# Patient Record
Sex: Male | Born: 1960 | Race: White | Hispanic: No | Smoking: Former smoker
Health system: Southern US, Community
[De-identification: ages and names within clinical notes are randomized; demographics above are authoritative.]

## PROBLEM LIST (undated history)

## (undated) DIAGNOSIS — I10 Essential (primary) hypertension: Secondary | ICD-10-CM

## (undated) DIAGNOSIS — E785 Hyperlipidemia, unspecified: Secondary | ICD-10-CM

## (undated) DIAGNOSIS — E7800 Pure hypercholesterolemia, unspecified: Secondary | ICD-10-CM

## (undated) HISTORY — PX: VASECTOMY: SHX75

## (undated) HISTORY — PX: WISDOM TOOTH EXTRACTION: SHX21

## (undated) HISTORY — DX: Essential (primary) hypertension: I10

## (undated) HISTORY — PX: COLONOSCOPY: SHX174

---

## 2010-01-11 ENCOUNTER — Ambulatory Visit: Payer: Self-pay | Admitting: Gastroenterology

## 2015-05-01 DIAGNOSIS — Z8673 Personal history of transient ischemic attack (TIA), and cerebral infarction without residual deficits: Secondary | ICD-10-CM

## 2015-05-01 HISTORY — DX: Personal history of transient ischemic attack (TIA), and cerebral infarction without residual deficits: Z86.73

## 2016-03-26 ENCOUNTER — Inpatient Hospital Stay (HOSPITAL_COMMUNITY)
Admission: EM | Admit: 2016-03-26 | Discharge: 2016-03-27 | DRG: 065 | Disposition: A | Discharge status: Home / Self Care | Payer: Managed Care, Other (non HMO) | Attending: Internal Medicine | Admitting: Internal Medicine

## 2016-03-26 ENCOUNTER — Encounter (HOSPITAL_COMMUNITY): Payer: Self-pay

## 2016-03-26 ENCOUNTER — Emergency Department (HOSPITAL_COMMUNITY): Payer: Managed Care, Other (non HMO)

## 2016-03-26 DIAGNOSIS — R531 Weakness: Secondary | ICD-10-CM

## 2016-03-26 DIAGNOSIS — Z6832 Body mass index (BMI) 32.0-32.9, adult: Secondary | ICD-10-CM

## 2016-03-26 DIAGNOSIS — R479 Unspecified speech disturbances: Secondary | ICD-10-CM | POA: Diagnosis present

## 2016-03-26 DIAGNOSIS — R4781 Slurred speech: Secondary | ICD-10-CM | POA: Diagnosis not present

## 2016-03-26 DIAGNOSIS — E7800 Pure hypercholesterolemia, unspecified: Secondary | ICD-10-CM | POA: Diagnosis present

## 2016-03-26 DIAGNOSIS — G458 Other transient cerebral ischemic attacks and related syndromes: Secondary | ICD-10-CM | POA: Diagnosis not present

## 2016-03-26 DIAGNOSIS — R030 Elevated blood-pressure reading, without diagnosis of hypertension: Secondary | ICD-10-CM | POA: Diagnosis not present

## 2016-03-26 DIAGNOSIS — R29898 Other symptoms and signs involving the musculoskeletal system: Secondary | ICD-10-CM | POA: Diagnosis not present

## 2016-03-26 DIAGNOSIS — I638 Other cerebral infarction: Principal | ICD-10-CM | POA: Diagnosis present

## 2016-03-26 DIAGNOSIS — I10 Essential (primary) hypertension: Secondary | ICD-10-CM | POA: Diagnosis present

## 2016-03-26 DIAGNOSIS — I161 Hypertensive emergency: Secondary | ICD-10-CM | POA: Diagnosis present

## 2016-03-26 DIAGNOSIS — E785 Hyperlipidemia, unspecified: Secondary | ICD-10-CM

## 2016-03-26 DIAGNOSIS — I998 Other disorder of circulatory system: Secondary | ICD-10-CM | POA: Diagnosis present

## 2016-03-26 DIAGNOSIS — Z87891 Personal history of nicotine dependence: Secondary | ICD-10-CM

## 2016-03-26 DIAGNOSIS — R2990 Unspecified symptoms and signs involving the nervous system: Secondary | ICD-10-CM

## 2016-03-26 DIAGNOSIS — E669 Obesity, unspecified: Secondary | ICD-10-CM | POA: Diagnosis present

## 2016-03-26 DIAGNOSIS — G8321 Monoplegia of upper limb affecting right dominant side: Secondary | ICD-10-CM | POA: Diagnosis present

## 2016-03-26 DIAGNOSIS — I639 Cerebral infarction, unspecified: Secondary | ICD-10-CM

## 2016-03-26 DIAGNOSIS — Z7982 Long term (current) use of aspirin: Secondary | ICD-10-CM

## 2016-03-26 HISTORY — DX: Pure hypercholesterolemia, unspecified: E78.00

## 2016-03-26 LAB — COMPREHENSIVE METABOLIC PANEL
ALBUMIN: 4.3 g/dL (ref 3.5–5.0)
ALK PHOS: 74 U/L (ref 38–126)
ALT: 23 U/L (ref 17–63)
AST: 25 U/L (ref 15–41)
Anion gap: 8 (ref 5–15)
BUN: 23 mg/dL — AB (ref 6–20)
CHLORIDE: 105 mmol/L (ref 101–111)
CO2: 26 mmol/L (ref 22–32)
CREATININE: 1.16 mg/dL (ref 0.61–1.24)
Calcium: 9.6 mg/dL (ref 8.9–10.3)
GFR calc Af Amer: >60 mL/min (ref >60)
GFR calc non Af Amer: >60 mL/min (ref >60)
GLUCOSE: 118 mg/dL — AB (ref 65–99)
Potassium: 4.3 mmol/L (ref 3.5–5.1)
SODIUM: 139 mmol/L (ref 135–145)
Total Bilirubin: 0.3 mg/dL (ref 0.3–1.2)
Total Protein: 6.3 g/dL — ABNORMAL LOW (ref 6.5–8.1)

## 2016-03-26 LAB — CBC
HCT: 43.1 % (ref 39.0–52.0)
Hemoglobin: 15.1 g/dL (ref 13.0–17.0)
MCH: 30.4 pg (ref 26.0–34.0)
MCHC: 35 g/dL (ref 30.0–36.0)
MCV: 86.9 fL (ref 78.0–100.0)
PLATELETS: 264 10*3/uL (ref 150–400)
RBC: 4.96 MIL/uL (ref 4.22–5.81)
RDW: 12.4 % (ref 11.5–15.5)
WBC: 8.9 10*3/uL (ref 4.0–10.5)

## 2016-03-26 LAB — PROTIME-INR
INR: 0.89
PROTHROMBIN TIME: 12 s (ref 11.4–15.2)

## 2016-03-26 LAB — DIFFERENTIAL
BASOS ABS: 0 10*3/uL (ref 0.0–0.1)
BASOS PCT: 0 %
Eosinophils Absolute: 0.2 10*3/uL (ref 0.0–0.7)
Eosinophils Relative: 2 %
LYMPHS PCT: 40 %
Lymphs Abs: 3.6 10*3/uL (ref 0.7–4.0)
Monocytes Absolute: 0.9 10*3/uL (ref 0.1–1.0)
Monocytes Relative: 10 %
NEUTROS ABS: 4.2 10*3/uL (ref 1.7–7.7)
Neutrophils Relative %: 48 %

## 2016-03-26 LAB — I-STAT CHEM 8, ED
BUN: 26 mg/dL — ABNORMAL HIGH (ref 6–20)
CHLORIDE: 104 mmol/L (ref 101–111)
CREATININE: 1.2 mg/dL (ref 0.61–1.24)
Calcium, Ion: 1.19 mmol/L (ref 1.15–1.40)
Glucose, Bld: 113 mg/dL — ABNORMAL HIGH (ref 65–99)
HEMATOCRIT: 44 % (ref 39.0–52.0)
HEMOGLOBIN: 15 g/dL (ref 13.0–17.0)
POTASSIUM: 4.2 mmol/L (ref 3.5–5.1)
Sodium: 142 mmol/L (ref 135–145)
TCO2: 26 mmol/L (ref 0–100)

## 2016-03-26 LAB — CBG MONITORING, ED: Glucose-Capillary: 107 mg/dL — ABNORMAL HIGH (ref 65–99)

## 2016-03-26 LAB — I-STAT TROPONIN, ED: Troponin i, poc: 0 ng/mL (ref 0.00–0.08)

## 2016-03-26 LAB — APTT: APTT: 27 s (ref 24–36)

## 2016-03-26 MED ORDER — ASPIRIN 300 MG RE SUPP: 300.0000 mg | Freq: Every day | RECTAL | Status: DC

## 2016-03-26 MED ORDER — ACETAMINOPHEN 325 MG PO TABS: 650.0000 mg | ORAL_TABLET | ORAL | Status: DC | PRN

## 2016-03-26 MED ORDER — ASPIRIN 325 MG PO TABS
325.0000 mg | ORAL_TABLET | Freq: Every day | ORAL | Status: DC
  Administered 2016-03-27: 325 mg via ORAL
  Filled 2016-03-26: qty 1

## 2016-03-26 MED ORDER — HYDRALAZINE HCL 20 MG/ML IJ SOLN: 10.0000 mg | INTRAMUSCULAR | Status: DC | PRN

## 2016-03-26 MED ORDER — HYDRALAZINE HCL 20 MG/ML IJ SOLN
10.0000 mg | INTRAMUSCULAR | Status: DC
  Filled 2016-03-26: qty 1

## 2016-03-26 MED ORDER — ACETAMINOPHEN 650 MG RE SUPP: 650.0000 mg | RECTAL | Status: DC | PRN

## 2016-03-26 MED ORDER — STROKE: EARLY STAGES OF RECOVERY BOOK
Freq: Once | Status: AC
  Administered 2016-03-27: 1
  Filled 2016-03-26: qty 1

## 2016-03-26 NOTE — ED Triage Notes (Signed)
Pt comlpaining of Right sided lip numbness and R arm weakness. Pt with marked weakness in R arm. Pt states normal sensation in bilateral extremities. Jeraldine LootsLockwood, MD at bedside.

## 2016-03-26 NOTE — ED Provider Notes (Signed)
[facility]-EMERGENCY DEPT Provider Note   CSN: [id number] Arrival date & time: 03/26/16  2214   An emergency department physician performed an initial assessment on this suspected stroke patient at 2225.  History   Chief Complaint Chief Complaint  Patient presents with  . Code Stroke    HPI Judd LienJack Brule is a 55 y.o. male.  HPI Patient presents with new onset right perioral numbness, right arm discoordination. Last seen normal was about one hour prior to ED arrival. Patient states that he was well prior to this. Family member with the patient states the patient also has speech difficulty. Patient denies pain, confusion, disorientation. No recent medication, left eye, activity changes.  Past Medical History:  Diagnosis Date  . Hypercholesterolemia     Patient Active Problem List   Diagnosis Date Noted  . Speech abnormality 03/26/2016  . Stroke-like symptoms 03/26/2016    Past Surgical History:  Procedure Laterality Date  . VASECTOMY         Home Medications    Prior to Admission medications   Medication Sig Start Date End Date Taking? Authorizing Provider  fluticasone (FLONASE) 50 MCG/ACT nasal spray Place 1-2 sprays into both nostrils daily as needed for allergies.  01/21/16   Historical Provider, MD    Family History Family History  Problem Relation Age of Onset  . Suicidality Father   . Hypertension Paternal Grandmother   . Hypertension Paternal Grandfather     Social History Social History  Substance Use Topics  . Smoking status: Former Games developermoker  . Smokeless tobacco: Never Used  . Alcohol use No     Comment: Former EtOH use, quit four years ago.     Allergies   Patient has no known allergies.   Review of Systems Review of Systems  Constitutional:       Per HPI, otherwise negative  HENT:       Per HPI, otherwise negative  Respiratory:       Per HPI, otherwise negative  Cardiovascular:       Per HPI, otherwise negative  Gastrointestinal:  Negative for vomiting.  Endocrine:       Negative aside from HPI  Genitourinary:       Neg aside from HPI   Musculoskeletal:       Per HPI, otherwise negative  Skin: Negative.   Neurological: Positive for speech difficulty and numbness. Negative for seizures, syncope, facial asymmetry and headaches.     Physical Exam Updated Vital Signs BP 181/100   Pulse 89   Temp 98.5 F (36.9 C)   Resp 20   Wt 220 lb 10.9 oz (100.1 kg)   SpO2 98%   Physical Exam  Constitutional: He is oriented to person, place, and time. He appears well-developed. No distress.  HENT:  Head: Normocephalic and atraumatic.  Eyes: Conjunctivae and EOM are normal.  Cardiovascular: Normal rate and regular rhythm.   Pulmonary/Chest: Effort normal. No stridor. No respiratory distress.  Abdominal: He exhibits no distension.  Musculoskeletal: He exhibits no edema.  Neurological: He is alert and oriented to person, place, and time. He displays no atrophy. He exhibits abnormal muscle tone.  Notable inability for the patient to perform finger-nose alternating motion with the right arm compared to the left arm, with spastic movements of the right arm, unremarkable left arm, lower extremity neurologic evaluation. Speech is clear, brief.   Skin: Skin is warm and dry.  Psychiatric: He has a normal mood and affect.  Nursing note and vitals reviewed.  ED Treatments / Results  Labs (all labs ordered are listed, but only abnormal results are displayed) Labs Reviewed  COMPREHENSIVE METABOLIC PANEL - Abnormal; Notable for the following:       Result Value   Glucose, Bld 118 (*)    BUN 23 (*)    Total Protein 6.3 (*)    All other components within normal limits  CBG MONITORING, ED - Abnormal; Notable for the following:    Glucose-Capillary 107 (*)    All other components within normal limits  I-STAT CHEM 8, ED - Abnormal; Notable for the following:    BUN 26 (*)    Glucose, Bld 113 (*)    All other components  within normal limits  PROTIME-INR  APTT  CBC  DIFFERENTIAL  I-STAT TROPOININ, ED    EKG  EKG Interpretation  Date/Time:  Monday March 26 2016 22:23:58 EST Ventricular Rate:  96 PR Interval:  168 QRS Duration: 72 QT Interval:  344 QTC Calculation: 434 R Axis:   39 Text Interpretation:  Normal sinus rhythm Artifact ST-t wave abnormality Abnormal ekg Confirmed by Gerhard MunchLOCKWOOD, Deshan Hemmelgarn  MD [phone]([id number]) on 03/26/2016 10:27:38 PM       Radiology Ct Head Code Stroke W/o Cm  Result Date: 03/26/2016 CLINICAL DATA:  Code stroke. Right arm weakness and left facial droop EXAM: CT HEAD WITHOUT CONTRAST TECHNIQUE: Contiguous axial images were obtained from the base of the skull through the vertex without intravenous contrast. COMPARISON:  None. FINDINGS: Brain: No mass lesion, intraparenchymal hemorrhage or extra-axial collection. No evidence of acute cortical infarct. Brain parenchyma and CSF-containing spaces are normal for age. Vascular: No hyperdense vessel or unexpected calcification. Skull: Normal visualized skull base, calvarium and extracranial soft tissues. Sinuses/Orbits: No sinus fluid levels or advanced mucosal thickening. No mastoid effusion. Normal orbits. ASPECTS [facility]([place] Stroke Program Early CT Score) - Ganglionic level infarction (caudate, lentiform nuclei, internal capsule, insula, M1-M3 cortex): 7 - Supraganglionic infarction (M4-M6 cortex): 3 Total score (0-10 with 10 being normal): 10 IMPRESSION: 1. Normal head CT. 2. ASPECTS is 10. These results were called by telephone at the time of interpretation on 03/26/2016 at 10:48 pm to Dr. Noel Christmasharles Stewart, who verbally acknowledged these results. Electronically Signed   By: Deatra RobinsonKevin  Herman M.D.   On: 03/26/2016 22:48    Procedures Procedures (including critical care time)  Medications Ordered in ED Medications  hydrALAZINE (APRESOLINE) injection 10 mg (not administered)     Initial Impression / Assessment and Plan / ED Course  I have  reviewed the triage vital signs and the nursing notes.  Pertinent labs & imaging results that were available during my care of the patient were reviewed by me and considered in my medical decision making (see chart for details).  Clinical Course     On repeat exam patient is similar condition. Initial CT does not demonstrate hemorrhage. Patient's blood pressure was initially 180 systolic, but increased to over 200 systolic. Patient received a dose of hydralazine. After discussion with our neurology colleagues, review of initial labs, the patient was admitted to the hospitalist team for further evaluation of strokelike symptoms, concern for TIA versus stroke.   Final Clinical Impressions(s) / ED Diagnoses   Final diagnoses:  Slurred speech  Acute right-sided weakness  Stroke-like symptoms    New Prescriptions New Prescriptions   No medications on file     Gerhard Munchobert Kamaree Berkel, MD 03/26/16 2337

## 2016-03-26 NOTE — H&P (Signed)
History and Physical    Jeffery LienJack Paro EAV:[id number] DOB: Dec 20, 1960 DOA: 03/26/2016  PCP: Evelene CroonNIEMEYER, MEINDERT, MD   Patient coming from: Home  Chief Complaint: Right face numbness and right arm weakness  HPI: Jeffery Small is a 55 y.o. gentleman with a history of hypercholesterolemia (says that he took medication for one year then stopped under the direction of his PCP) who presents to the ED for evaluation of acute onset right face/lip numbness and right upper extremity weakness and numbness.  Symptoms started around 9:30pm while he was watching TV.  He called for his daughter, who noted mild slurred speech but no facial droop.  No weakness in his legs.  He did not take anything for his symptoms at home and he did not call 911.  He came to the ED by private vehicle.   ED Course: Code Stroke was called.  The patient has been evaluated by neurology.  Head CT does not show acute findings.  MRI of the brain is pending.  He has markedly elevated blood pressures (denies prior history).  He has received one dose of IV hydralazine in the ED.  Hospitalist asked to place in observation while further neuro eval is pending.    Review of Systems: As per HPI otherwise 10 point review of systems negative.    Past Medical History:  Diagnosis Date  . Hypercholesterolemia     Past Surgical History:  Procedure Laterality Date  . VASECTOMY       reports that he has quit smoking. He has never used smokeless tobacco. He reports that he does not drink alcohol or use drugs. Stopped smoking 8-9 years ago. Stopped drinking EtOH 4 years ago. No illicit drug use. He is divorced.  He has two adult children.  No Known Allergies  Family History  Problem Relation Age of Onset  . Suicidality Father   . Hypertension Paternal Grandmother   . Hypertension Paternal Grandfather   He reports that he mother and two sisters are alive and healthy.   Prior to Admission medications   Medication Sig Start Date End Date  Taking? Authorizing Provider  fluticasone (FLONASE) 50 MCG/ACT nasal spray Place 1-2 sprays into both nostrils daily as needed for allergies.  01/21/16   Historical Provider, MD    Physical Exam: Vitals:   03/26/16 2245 03/26/16 2300 03/26/16 2315 03/26/16 2326  BP: (!) 206/106 (!) 161/103 181/100   Pulse: 94 87 89   Resp: 18 17 20    Temp:    98.5 F (36.9 C)  SpO2: 99% 97% 98%   Weight:          Constitutional: NAD, calm, comfortable Vitals:   03/26/16 2245 03/26/16 2300 03/26/16 2315 03/26/16 2326  BP: (!) 206/106 (!) 161/103 181/100   Pulse: 94 87 89   Resp: 18 17 20    Temp:    98.5 F (36.9 C)  SpO2: 99% 97% 98%   Weight:       Eyes: PERRL, lids and conjunctivae normal ENMT: Mucous membranes are moist. Posterior pharynx clear of any exudate or lesions. Normal dentition.  Neck: normal appearance, supple Respiratory: clear to auscultation bilaterally, no wheezing, no crackles. Normal respiratory effort. No accessory muscle use.  Cardiovascular: Normal rate, regular rhythm, no murmurs / rubs / gallops. No extremity edema. 2+ pedal pulses. No carotid bruits.  GI: abdomen is soft and compressible.  No distention.  No tenderness.  No masses palpated.  Bowel sounds are present. Musculoskeletal:  No joint deformity in upper  and lower extremities. Good ROM, no contractures. Normal muscle tone.  Skin: no rashes, warm and dry Neurologic: CN 2-12 grossly intact. Sensation still diminished in the 4th and 5th digits of his right hand.  He says that his facial numbness has resolved.  He may still have subtle weakness in his right arm compared to left, but it is very mild.  Strength and gross motor function symmetric in lower extremities.  Very mild pronator drift in the right.  Psychiatric: Normal judgment and insight. Alert and oriented x 3. Normal mood.     Labs on Admission: I have personally reviewed following labs and imaging studies  CBC:  Recent Labs Lab 03/26/16 2222  03/26/16 2236  WBC 8.9  --   NEUTROABS 4.2  --   HGB 15.1 15.0  HCT 43.1 44.0  MCV 86.9  --   PLT 264  --    Basic Metabolic Panel:  Recent Labs Lab 03/26/16 2222 03/26/16 2236  NA 139 142  K 4.3 4.2  CL 105 104  CO2 26  --   GLUCOSE 118* 113*  BUN 23* 26*  CREATININE 1.16 1.20  CALCIUM 9.6  --    GFR: CrCl cannot be calculated (Unknown ideal weight.). Liver Function Tests:  Recent Labs Lab 03/26/16 2222  AST 25  ALT 23  ALKPHOS 74  BILITOT 0.3  PROT 6.3*  ALBUMIN 4.3   Coagulation Profile:  Recent Labs Lab 03/26/16 2222  INR 0.89   CBG:  Recent Labs Lab 03/26/16 2248  GLUCAP 107*    Radiological Exams on Admission: Ct Head Code Stroke W/o Cm  Result Date: 03/26/2016 CLINICAL DATA:  Code stroke. Right arm weakness and left facial droop EXAM: CT HEAD WITHOUT CONTRAST TECHNIQUE: Contiguous axial images were obtained from the base of the skull through the vertex without intravenous contrast. COMPARISON:  None. FINDINGS: Brain: No mass lesion, intraparenchymal hemorrhage or extra-axial collection. No evidence of acute cortical infarct. Brain parenchyma and CSF-containing spaces are normal for age. Vascular: No hyperdense vessel or unexpected calcification. Skull: Normal visualized skull base, calvarium and extracranial soft tissues. Sinuses/Orbits: No sinus fluid levels or advanced mucosal thickening. No mastoid effusion. Normal orbits. ASPECTS [facility]([place] Stroke Program Early CT Score) - Ganglionic level infarction (caudate, lentiform nuclei, internal capsule, insula, M1-M3 cortex): 7 - Supraganglionic infarction (M4-M6 cortex): 3 Total score (0-10 with 10 being normal): 10 IMPRESSION: 1. Normal head CT. 2. ASPECTS is 10. These results were called by telephone at the time of interpretation on 03/26/2016 at 10:48 pm to Dr. Noel Christmasharles Stewart, who verbally acknowledged these results. Electronically Signed   By: Deatra RobinsonKevin  Herman M.D.   On: 03/26/2016 22:48    EKG:  Independently reviewed. NSR.  No acute ST elevations.  Assessment/Plan Active Problems:   Speech abnormality   Stroke-like symptoms      Right face numbness and RUE weakness with markedly elevated blood pressures concerning for acute CVA --Place in observation.  Symptoms have improved rapidly.  Chief complaint now is that sensation is not back to normal in his right hand. --Neurology consult greatly appreciated. --MRI/MRA brain pending --Complete echo and carotid ultrasound --Check A1c and lipid panel for screen for additional stroke risk factors --PT/OT/Speech evals  --RN stroke swallow screen performed in the ED.  Heart healthy diet ordered in the ED. --Treat with full strength aspirin for now  Elevated blood pressure without a history of HTN --Permissive HTN for now while evaluating for stroke --Will need to placed on anti-hypertensives when  OK with neurology  History of hypercholesterolemia --Fasting lipid panel will be checked as a part of the stroke protocol --He will need to be back on a statin   DVT prophylaxis: SCDs Code Status: FULL Family Communication: Daughter present at bedside in the ED at time of admission. Disposition Plan: Expect he will go home at discharge. Consults called: Neurology Admission status: Place in observation with telemetry monitoring   TIME SPENT: 60 minutes   Jerene Bears MD [facility] Pager [phone]  If 7PM-7AM, please contact night-coverage [email or web address] Password [facility]  03/26/2016, 11:31 PM

## 2016-03-26 NOTE — Progress Notes (Signed)
Code Stroke called on 55 y.o male. Pertinent medical history includes hypertension. LSN 2130. Per Pt he noticed acute numbness in right lip  and right arm weakness and discoordination. [facility]. Pt taken to CT STAT, reviewed per Neurologist as negative for acute abnormalities. NIHSS completed yielding 3 for mild dysarthria, sensory deficit right arm, and ataxia. Pt to remain within TPA window until 0200. For MRI tonight and admission for stroke work up.

## 2016-03-26 NOTE — ED Provider Notes (Signed)
MSE was initiated and I personally evaluated the patient and placed orders (if any) at  2220 on March 26, 2016. Patient presenting with new perioral numbness, and right arm discoordination, onset less than 1 hour ago.  The patient appears stable so that the remainder of the MSE may be completed by another provider.   Jeffery Munchobert Brithany Whitworth, MD 03/26/16 2232

## 2016-03-26 NOTE — ED Notes (Addendum)
Pt LKW 2130, CT complete.  MRI ordered but was notified that will be approximately 0100 before pt is taken due to other stat pts.  18g Right AC.  Swallow screen passed.  Symptoms of facial numbness and slurred speech resolved.  Minor drift and weakness of the right arm remains.  While pt remains in the TPA window must have q 2383m vitals and q 599m neuro checks

## 2016-03-26 NOTE — Consult Note (Signed)
Admission H&P    Chief Complaint: Acute onset right facial numbness, right hand weakness and slurred speech.  HPI: Jeffery Small is an 55 y.o. male with a history of hypertension, but currently not treated, presenting with acute onset of numbness involving right side of his mouth, slurred speech and marked weakness and loss of control of his right hand. He had no symptoms involving his right lower extremity. There is no previous history of stroke nor TIA. He has not been on antiplatelet therapy daily. CT scan of his head showed no acute intracranial abnormality. Deficits began to improve while in the ED. NIH stroke score was 3.  LSN: 9:30 PM on 05/27/2015 tPA Given: No: Deficits rapidly improving mRankin:  Past medical history: History of hypertension   No past surgical history on file.  Family history: Reviewed and was noncontributory.  Social History: Patient smoked cigarettes until about 8 years ago.  Allergies: Not on File  Medications: Alavert  for for allergies  ROS: History obtained from the patient  General ROS: negative for - chills, fatigue, fever, night sweats, weight gain or weight loss Psychological ROS: negative for - behavioral disorder, hallucinations, memory difficulties, mood swings or suicidal ideation Ophthalmic ROS: negative for - blurry vision, double vision, eye pain or loss of vision ENT ROS: negative for - epistaxis, nasal discharge, oral lesions, sore throat, tinnitus or vertigo Allergy and Immunology ROS: negative for - hives or itchy/watery eyes Hematological and Lymphatic ROS: negative for - bleeding problems, bruising or swollen lymph nodes Endocrine ROS: negative for - galactorrhea, hair pattern changes, polydipsia/polyuria or temperature intolerance Respiratory ROS: negative for - cough, hemoptysis, shortness of breath or wheezing Cardiovascular ROS: negative for - chest pain, dyspnea on exertion, edema or irregular heartbeat Gastrointestinal ROS:  negative for - abdominal pain, diarrhea, hematemesis, nausea/vomiting or stool incontinence Genito-Urinary ROS: negative for - dysuria, hematuria, incontinence or urinary frequency/urgency Musculoskeletal ROS: negative for - joint swelling or muscular weakness Neurological ROS: as noted in HPI Dermatological ROS: negative for rash and skin lesion changes  Physical Examination: Blood pressure (!) 179/114, pulse 90, temperature 98.5 F (36.9 C), resp. rate 16, weight 100.1 kg (220 lb 10.9 oz), SpO2 98 %.  HEENT-  Normocephalic, no lesions, without obvious abnormality.  Normal external eye and conjunctiva.  Normal TM's bilaterally.  Normal auditory canals and external ears. Normal external nose, mucus membranes and septum.  Normal pharynx. Neck supple with no masses, nodes, nodules or enlargement. Cardiovascular - regular rate and rhythm, S1, S2 normal, no murmur, click, rub or gallop Lungs - chest clear, no wheezing, rales, normal symmetric air entry Abdomen - soft, non-tender; bowel sounds normal; no masses,  no organomegaly Extremities - no joint deformities, effusion, or inflammation and no edema  Neurologic Examination: Mental Status: Alert, oriented, thought content appropriate.  Speech fluent without evidence of aphasia. Able to follow commands without difficulty. Cranial Nerves: II-Visual fields were normal. III/IV/VI-Pupils were equal and reacted normally to light. Extraocular movements were full and conjugate.    V/VII-no facial numbness and no facial weakness. VIII-normal. X minimal dysarthria; normal speech and symmetrical palatal movement. XI: trapezius strength/neck flexion strength normal bilaterally XII-midline tongue extension with normal strength. Motor: Moderate distal right upper extremity weakness; motor exam otherwise unremarkable. Sensory: Normal throughout. Deep Tendon Reflexes: 2+ and symmetric. Plantars: Flexor bilaterally Cerebellar: Moderate coordination  abnormality of right upper extremity compared to left. Carotid auscultation: Normal  Results for orders placed or performed during the hospital encounter of 03/26/16 (from the  past 48 hour(s))  Protime-INR     Status: None   Collection Time: 03/26/16 10:22 PM  Result Value Ref Range   Prothrombin Time 12.0 11.4 - 15.2 seconds   INR 0.89   APTT     Status: None   Collection Time: 03/26/16 10:22 PM  Result Value Ref Range   aPTT 27 24 - 36 seconds  CBC     Status: None   Collection Time: 03/26/16 10:22 PM  Result Value Ref Range   WBC 8.9 4.0 - 10.5 K/uL   RBC 4.96 4.22 - 5.81 MIL/uL   Hemoglobin 15.1 13.0 - 17.0 g/dL   HCT 16.1 09.6 - 04.5 %   MCV 86.9 78.0 - 100.0 fL   MCH 30.4 26.0 - 34.0 pg   MCHC 35.0 30.0 - 36.0 g/dL   RDW 40.9 81.1 - 91.4 %   Platelets 264 150 - 400 K/uL  Differential     Status: None   Collection Time: 03/26/16 10:22 PM  Result Value Ref Range   Neutrophils Relative % 48 %   Neutro Abs 4.2 1.7 - 7.7 K/uL   Lymphocytes Relative 40 %   Lymphs Abs 3.6 0.7 - 4.0 K/uL   Monocytes Relative 10 %   Monocytes Absolute 0.9 0.1 - 1.0 K/uL   Eosinophils Relative 2 %   Eosinophils Absolute 0.2 0.0 - 0.7 K/uL   Basophils Relative 0 %   Basophils Absolute 0.0 0.0 - 0.1 K/uL  I-stat troponin, ED     Status: None   Collection Time: 03/26/16 10:33 PM  Result Value Ref Range   Troponin i, poc 0.00 0.00 - 0.08 ng/mL   Comment 3            Comment: Due to the release kinetics of cTnI, a negative result within the first hours of the onset of symptoms does not rule out myocardial infarction with certainty. If myocardial infarction is still suspected, repeat the test at appropriate intervals.   I-Stat Chem 8, ED     Status: Abnormal   Collection Time: 03/26/16 10:36 PM  Result Value Ref Range   Sodium 142 135 - 145 mmol/L   Potassium 4.2 3.5 - 5.1 mmol/L   Chloride 104 101 - 111 mmol/L   BUN 26 (H) 6 - 20 mg/dL   Creatinine, Ser 7.82 0.61 - 1.24 mg/dL    Glucose, Bld 956 (H) 65 - 99 mg/dL   Calcium, Ion 2.13 0.86 - 1.40 mmol/L   TCO2 26 0 - 100 mmol/L   Hemoglobin 15.0 13.0 - 17.0 g/dL   HCT 57.8 46.9 - 62.9 %  CBG monitoring, ED     Status: Abnormal   Collection Time: 03/26/16 10:48 PM  Result Value Ref Range   Glucose-Capillary 107 (H) 65 - 99 mg/dL   Ct Head Code Stroke W/o Cm  Result Date: 03/26/2016 CLINICAL DATA:  Code stroke. Right arm weakness and left facial droop EXAM: CT HEAD WITHOUT CONTRAST TECHNIQUE: Contiguous axial images were obtained from the base of the skull through the vertex without intravenous contrast. COMPARISON:  None. FINDINGS: Brain: No mass lesion, intraparenchymal hemorrhage or extra-axial collection. No evidence of acute cortical infarct. Brain parenchyma and CSF-containing spaces are normal for age. Vascular: No hyperdense vessel or unexpected calcification. Skull: Normal visualized skull base, calvarium and extracranial soft tissues. Sinuses/Orbits: No sinus fluid levels or advanced mucosal thickening. No mastoid effusion. Normal orbits. ASPECTS [facility] Stroke Program Early CT Score) - Ganglionic level infarction (caudate, lentiform  nuclei, internal capsule, insula, M1-M3 cortex): 7 - Supraganglionic infarction (M4-M6 cortex): 3 Total score (0-10 with 10 being normal): 10 IMPRESSION: 1. Normal head CT. 2. ASPECTS is 10. These results were called by telephone at the time of interpretation on 03/26/2016 at 10:48 pm to Dr. Noel Christmasharles [place] Warriner, who verbally acknowledged these results. Electronically Signed   By: Deatra RobinsonKevin  Herman M.D.   On: 03/26/2016 22:48    Assessment: 55 y.o. male with a history of hypertension, currently untreated, presenting with TIA or possible left subcortical MCA territory ischemic infarction.  Stroke Risk Factors - hypertension  Plan: 1. HgbA1c, fasting lipid panel 2. MRI, MRA  of the brain without contrast 3. PT consult, OT consult, Speech consult 4. Echocardiogram 5. Carotid dopplers 6.  Prophylactic therapy-Antiplatelet med: Aspirin  7. Risk factor modification 8. Telemetry monitoring  C.R. Roseanne RenoStewart, MD [facility] [phone]  03/26/2016, 10:58 PM

## 2016-03-27 ENCOUNTER — Observation Stay (HOSPITAL_COMMUNITY): Payer: Managed Care, Other (non HMO)

## 2016-03-27 ENCOUNTER — Inpatient Hospital Stay (HOSPITAL_COMMUNITY): Payer: Managed Care, Other (non HMO)

## 2016-03-27 DIAGNOSIS — R471 Dysarthria and anarthria: Secondary | ICD-10-CM

## 2016-03-27 DIAGNOSIS — I638 Other cerebral infarction: Secondary | ICD-10-CM | POA: Diagnosis present

## 2016-03-27 DIAGNOSIS — R4781 Slurred speech: Secondary | ICD-10-CM | POA: Diagnosis present

## 2016-03-27 DIAGNOSIS — I639 Cerebral infarction, unspecified: Secondary | ICD-10-CM

## 2016-03-27 DIAGNOSIS — Z87891 Personal history of nicotine dependence: Secondary | ICD-10-CM | POA: Diagnosis not present

## 2016-03-27 DIAGNOSIS — I161 Hypertensive emergency: Secondary | ICD-10-CM | POA: Diagnosis present

## 2016-03-27 DIAGNOSIS — E785 Hyperlipidemia, unspecified: Secondary | ICD-10-CM | POA: Diagnosis not present

## 2016-03-27 DIAGNOSIS — I6789 Other cerebrovascular disease: Secondary | ICD-10-CM | POA: Diagnosis not present

## 2016-03-27 DIAGNOSIS — Z7982 Long term (current) use of aspirin: Secondary | ICD-10-CM | POA: Diagnosis not present

## 2016-03-27 DIAGNOSIS — E7800 Pure hypercholesterolemia, unspecified: Secondary | ICD-10-CM | POA: Diagnosis present

## 2016-03-27 DIAGNOSIS — E669 Obesity, unspecified: Secondary | ICD-10-CM | POA: Diagnosis present

## 2016-03-27 DIAGNOSIS — Z6832 Body mass index (BMI) 32.0-32.9, adult: Secondary | ICD-10-CM | POA: Diagnosis not present

## 2016-03-27 DIAGNOSIS — I998 Other disorder of circulatory system: Secondary | ICD-10-CM | POA: Diagnosis present

## 2016-03-27 DIAGNOSIS — I10 Essential (primary) hypertension: Secondary | ICD-10-CM | POA: Diagnosis present

## 2016-03-27 DIAGNOSIS — G8321 Monoplegia of upper limb affecting right dominant side: Secondary | ICD-10-CM | POA: Diagnosis present

## 2016-03-27 LAB — LIPID PANEL
CHOLESTEROL: 201 mg/dL — AB (ref 0–200)
HDL: 45 mg/dL (ref >40)
LDL Cholesterol: 141 mg/dL — ABNORMAL HIGH (ref 0–99)
Total CHOL/HDL Ratio: 4.5 RATIO
Triglycerides: 73 mg/dL (ref <150)
VLDL: 15 mg/dL (ref 0–40)

## 2016-03-27 LAB — ECHOCARDIOGRAM COMPLETE
HEIGHTINCHES: 73 in
Weight: 3982.39 oz

## 2016-03-27 MED ORDER — ATORVASTATIN CALCIUM 40 MG PO TABS
40.0000 mg | ORAL_TABLET | Freq: Every day | ORAL | Status: DC
  Administered 2016-03-27: 40 mg via ORAL
  Filled 2016-03-27: qty 1

## 2016-03-27 MED ORDER — ATORVASTATIN CALCIUM 40 MG PO TABS: 40.0000 mg | ORAL_TABLET | Freq: Every day | ORAL | 0 refills | Status: DC

## 2016-03-27 MED ORDER — CLOPIDOGREL BISULFATE 75 MG PO TABS: 75.0000 mg | ORAL_TABLET | Freq: Every day | ORAL | 0 refills | Status: DC

## 2016-03-27 MED ORDER — CLOPIDOGREL BISULFATE 75 MG PO TABS
75.0000 mg | ORAL_TABLET | Freq: Every day | ORAL | Status: DC
  Administered 2016-03-27: 75 mg via ORAL
  Filled 2016-03-27: qty 1

## 2016-03-27 NOTE — Progress Notes (Signed)
*  PRELIMINARY RESULTS* Vascular Ultrasound Carotid Duplex has been completed.  Preliminary findings: Bilateral: No significant (1-39%) ICA stenosis. Antegrade vertebral flow.   Farrel DemarkJill Eunice, RDMS, RVT  03/27/2016, 3:23 PM

## 2016-03-27 NOTE — Discharge Summary (Signed)
Physician Discharge Summary  Jeffery Small ZOX:[id number] DOB: June 21, 1960 DOA: 03/26/2016  PCP: Evelene Croon, MD  Admit date: 03/26/2016 Discharge date: 03/27/2016   Recommendations for Outpatient Follow-Up:   1. HgbA1C pending   Discharge Diagnosis:   Active Problems:   Speech abnormality   Stroke-like symptoms   CVA (cerebral vascular accident) [facility])   Discharge disposition:  Home.   Discharge Condition: Improved.  Diet recommendation: Low sodium, heart healthy.   Wound care: None.   History of Present Illness:   Jeffery Small is a 55 y.o. gentleman with a history of hypercholesterolemia (says that he took medication for one year then stopped under the direction of his PCP) who presents to the ED for evaluation of acute onset right face/lip numbness and right upper extremity weakness and numbness.  Symptoms started around 9:30pm while he was watching TV.  He called for his daughter, who noted mild slurred speech but no facial droop.  No weakness in his legs.  He did not take anything for his symptoms at home and he did not call 911.  He came to the ED by private vehicle.    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 Changed to plavix.  Patient counseled to be compliant with his antithrombotic medications  Hypertensive Emergency  BP as high as 206/106  Lowered this am 130-140s  Permissive hypertension (OK if < 220/120) but gradually normalize in 5-7 days  Long-term BP goal normotensive  Hyperlipidemia  Home meds:  No statin  LDL 141, goal < 70  Now on statin, lipitor 40 mg daily  Continue statin at  discharge  Other Stroke Risk Factors  Former Cigarette smoker  Obesity, Body mass index is 32.84 kg/m., recommend weight loss, diet and exercise as appropriate    Medical Consultants:    Neuro   Discharge Exam:   Vitals:   03/27/16 1001 03/27/16 1200  BP: (!) 162/93 (!) 135/97  Pulse: 75 82  Resp: 20 18  Temp:  97.9 F (36.6 C)   Vitals:   03/27/16 0600 03/27/16 0800 03/27/16 1001 03/27/16 1200  BP: (!) 129/96 (!) 145/96 (!) 162/93 (!) 135/97  Pulse:  74 75 82  Resp: 18 19 20 18   Temp: 97.7 F (36.5 C)   97.9 F (36.6 C)  TempSrc: Oral   Oral  SpO2: 94% 96% 95% 96%  Weight:      Height:        Gen:  NAD- right arm weakness   The results of significant diagnostics from this hospitalization (including imaging, microbiology, ancillary and laboratory) are listed below for reference.     Procedures and Diagnostic Studies:   Mr Shirlee Latch WJ Contrast  Result Date: 03/27/2016 CLINICAL DATA:  Right facial numbness and right arm weakness EXAM: MRI HEAD WITHOUT CONTRAST MRA HEAD WITHOUT CONTRAST TECHNIQUE: Multiplanar, multiecho pulse sequences of the brain and surrounding structures were obtained without intravenous contrast. Angiographic images of the head were obtained using MRA technique without contrast. COMPARISON:  None. FINDINGS: MRI HEAD FINDINGS Brain: There is a small focus of diffusion restriction at  the base of the left precentral gyrus, near the right hand motor area. No evidence of acute hemorrhage. There is mild multifocal hyperintense T2-weighted signal within the periventricular and deep white matter, most often seen in the setting of chronic microvascular ischemia. No mass lesion or midline shift. No hydrocephalus or extra-axial fluid collection. No age advanced or lobar predominant atrophy. The midline structures are normal. Vascular: Major intracranial arterial and venous sinus flow voids are preserved. No evidence of chronic microhemorrhage or amyloid  angiopathy. Skull and upper cervical spine: The visualized skull base, calvarium, upper cervical spine and extracranial soft tissues are normal. Sinuses/Orbits: No fluid levels or advanced mucosal thickening. No mastoid effusion. Normal orbits. MRA HEAD FINDINGS Intracranial internal carotid arteries: There is a small outpouching of the communicating segment of the left internal carotid artery, likely a small infundibulum. Otherwise, the intracranial internal carotid arteries are normal. Anterior cerebral arteries: Normal. Middle cerebral arteries: Normal. Posterior communicating arteries: Not visualized Posterior cerebral arteries: Normal. Basilar artery: Normal. Vertebral arteries: Left dominant. Normal. Superior cerebellar arteries: Normal. Anterior inferior cerebellar arteries: Normal on the right, not seen on the left. Posterior inferior cerebellar arteries: Normal on the left, not seen on the right. IMPRESSION: 1. Small area of acute ischemia at base of the left precentral gyrus, in keeping with the reported right-sided symptoms. No hemorrhage or mass effect. 2. 2 mm outpouching from the posterior aspect of the communicating segment of the left ICA is favored to be a small vascular infundibulum over an aneurysm. 3. Otherwise normal intracranial MRI. Electronically Signed   By: Deatra RobinsonKevin  Herman M.D.   On: 03/27/2016 04:11   Mr Brain Wo Contrast  Result Date: 03/27/2016 CLINICAL DATA:  Right facial numbness and right arm weakness EXAM: MRI HEAD WITHOUT CONTRAST MRA HEAD WITHOUT CONTRAST TECHNIQUE: Multiplanar, multiecho pulse sequences of the brain and surrounding structures were obtained without intravenous contrast. Angiographic images of the head were obtained using MRA technique without contrast. COMPARISON:  None. FINDINGS: MRI HEAD FINDINGS Brain: There is a small focus of diffusion restriction at the base of the left precentral gyrus, near the right hand motor area. No evidence of acute hemorrhage.  There is mild multifocal hyperintense T2-weighted signal within the periventricular and deep white matter, most often seen in the setting of chronic microvascular ischemia. No mass lesion or midline shift. No hydrocephalus or extra-axial fluid collection. No age advanced or lobar predominant atrophy. The midline structures are normal. Vascular: Major intracranial arterial and venous sinus flow voids are preserved. No evidence of chronic microhemorrhage or amyloid angiopathy. Skull and upper cervical spine: The visualized skull base, calvarium, upper cervical spine and extracranial soft tissues are normal. Sinuses/Orbits: No fluid levels or advanced mucosal thickening. No mastoid effusion. Normal orbits. MRA HEAD FINDINGS Intracranial internal carotid arteries: There is a small outpouching of the communicating segment of the left internal carotid artery, likely a small infundibulum. Otherwise, the intracranial internal carotid arteries are normal. Anterior cerebral arteries: Normal. Middle cerebral arteries: Normal. Posterior communicating arteries: Not visualized Posterior cerebral arteries: Normal. Basilar artery: Normal. Vertebral arteries: Left dominant. Normal. Superior cerebellar arteries: Normal. Anterior inferior cerebellar arteries: Normal on the right, not seen on the left. Posterior inferior cerebellar arteries: Normal on the left, not seen on the right. IMPRESSION: 1. Small area of acute ischemia at base of the left precentral gyrus, in keeping with the reported right-sided symptoms. No hemorrhage or mass effect. 2. 2 mm outpouching from the posterior aspect of the communicating segment of the  left ICA is favored to be a small vascular infundibulum over an aneurysm. 3. Otherwise normal intracranial MRI. Electronically Signed   By: Deatra RobinsonKevin  Herman M.D.   On: 03/27/2016 04:11   Ct Head Code Stroke W/o Cm  Result Date: 03/26/2016 CLINICAL DATA:  Code stroke. Right arm weakness and left facial droop EXAM:  CT HEAD WITHOUT CONTRAST TECHNIQUE: Contiguous axial images were obtained from the base of the skull through the vertex without intravenous contrast. COMPARISON:  None. FINDINGS: Brain: No mass lesion, intraparenchymal hemorrhage or extra-axial collection. No evidence of acute cortical infarct. Brain parenchyma and CSF-containing spaces are normal for age. Vascular: No hyperdense vessel or unexpected calcification. Skull: Normal visualized skull base, calvarium and extracranial soft tissues. Sinuses/Orbits: No sinus fluid levels or advanced mucosal thickening. No mastoid effusion. Normal orbits. ASPECTS [facility]([place]ta Stroke Program Early CT Score) - Ganglionic level infarction (caudate, lentiform nuclei, internal capsule, insula, M1-M3 cortex): 7 - Supraganglionic infarction (M4-M6 cortex): 3 Total score (0-10 with 10 being normal): 10 IMPRESSION: 1. Normal head CT. 2. ASPECTS is 10. These results were called by telephone at the time of interpretation on 03/26/2016 at 10:48 pm to Dr. Noel Christmasharles Stewart, who verbally acknowledged these results. Electronically Signed   By: Deatra RobinsonKevin  Herman M.D.   On: 03/26/2016 22:48     Labs:   Basic Metabolic Panel:  Recent Labs Lab 03/26/16 2222 03/26/16 2236  NA 139 142  K 4.3 4.2  CL 105 104  CO2 26  --   GLUCOSE 118* 113*  BUN 23* 26*  CREATININE 1.16 1.20  CALCIUM 9.6  --    GFR Estimated Creatinine Clearance: 90.5 mL/min (by C-G formula based on SCr of 1.2 mg/dL). Liver Function Tests:  Recent Labs Lab 03/26/16 2222  AST 25  ALT 23  ALKPHOS 74  BILITOT 0.3  PROT 6.3*  ALBUMIN 4.3   No results for input(s): LIPASE, AMYLASE in the last 168 hours. No results for input(s): AMMONIA in the last 168 hours. Coagulation profile  Recent Labs Lab 03/26/16 2222  INR 0.89    CBC:  Recent Labs Lab 03/26/16 2222 03/26/16 2236  WBC 8.9  --   NEUTROABS 4.2  --   HGB 15.1 15.0  HCT 43.1 44.0  MCV 86.9  --   PLT 264  --    Cardiac Enzymes: No  results for input(s): CKTOTAL, CKMB, CKMBINDEX, TROPONINI in the last 168 hours. BNP: Invalid input(s): POCBNP CBG:  Recent Labs Lab 03/26/16 2248  GLUCAP 107*   D-Dimer No results for input(s): DDIMER in the last 72 hours. Hgb A1c No results for input(s): HGBA1C in the last 72 hours. Lipid Profile  Recent Labs  03/27/16 0303  CHOL 201*  HDL 45  LDLCALC 141*  TRIG 73  CHOLHDL 4.5   Thyroid function studies No results for input(s): TSH, T4TOTAL, T3FREE, THYROIDAB in the last 72 hours.  Invalid input(s): FREET3 Anemia work up No results for input(s): VITAMINB12, FOLATE, FERRITIN, TIBC, IRON, RETICCTPCT in the last 72 hours. Microbiology No results found for this or any previous visit (from the past 240 hour(s)).   Discharge Instructions:   Discharge Instructions    Diet - low sodium heart healthy    Complete by:  As directed    Discharge instructions    Complete by:  As directed    LFTs 6 weeks   Increase activity slowly    Complete by:  As directed        Medication List  STOP taking these medications   aspirin EC 81 MG tablet     TAKE these medications   atorvastatin 40 MG tablet Commonly known as:  LIPITOR Take 1 tablet (40 mg total) by mouth daily at 6 PM.   clopidogrel 75 MG tablet Commonly known as:  PLAVIX Take 1 tablet (75 mg total) by mouth daily. Start taking on:  03/28/2016   fluticasone 50 MCG/ACT nasal spray Commonly known as:  FLONASE Place 2 sprays into both nostrils daily as needed for allergies.      Follow-up Information    Evelene Croon, MD Follow up in 1 week(s).   Specialty:  Family Medicine Contact information: [address] [place] [address] [phone]        SETHI,PRAMOD, MD Follow up in 6 week(s).   Specialties:  Neurology, Radiology Contact information: [address] [place] [address] [phone]            Time coordinating discharge: 35 min  Signed:  Joseph Art   [facility] 03/27/2016, 3:24 PM

## 2016-03-27 NOTE — Progress Notes (Signed)
STROKE TEAM PROGRESS NOTE   HISTORY OF PRESENT ILLNESS (per record) Jeffery Small is an 55 y.o. male with a history of hypertension, but currently not treated, presenting with acute onset of numbness involving right side of his mouth, slurred speech and marked weakness and loss of control of his right hand. He had no symptoms involving his right lower extremity. There is no previous history of stroke nor TIA. He has not been on antiplatelet therapy daily. CT scan of his head showed no acute intracranial abnormality. Deficits began to improve while in the ED. NIH stroke score was 3. He was LKW at 9:30 PM on 05/27/2015. patient was not administered IV t-PA secondary to deficits rapidly improving. He was admitted for further evaluation and treatment.   SUBJECTIVE (INTERVAL HISTORY) His wife and family is at the bedside.  Overall he feels his condition is rapidly improving, but still with some abnormal feelings in his R hand.    OBJECTIVE Temp:  [97.7 F (36.5 C)-98.5 F (36.9 C)] 97.7 F (36.5 C) (11/28 0600) Pulse Rate:  [74-94] 74 (11/28 0800) Cardiac Rhythm: Normal sinus rhythm (11/28 0812) Resp:  [16-20] 19 (11/28 0800) BP: (129-206)/(96-114) 145/96 (11/28 0800) SpO2:  [94 %-99 %] 96 % (11/28 0800) Weight:  [100.1 kg (220 lb 10.9 oz)-112.9 kg (248 lb 14.4 oz)] 112.9 kg (248 lb 14.4 oz) (11/28 0000)  CBC:  Recent Labs Lab 03/26/16 2222 03/26/16 2236  WBC 8.9  --   NEUTROABS 4.2  --   HGB 15.1 15.0  HCT 43.1 44.0  MCV 86.9  --   PLT 264  --     Basic Metabolic Panel:  Recent Labs Lab 03/26/16 2222 03/26/16 2236  NA 139 142  K 4.3 4.2  CL 105 104  CO2 26  --   GLUCOSE 118* 113*  BUN 23* 26*  CREATININE 1.16 1.20  CALCIUM 9.6  --     Lipid Panel:    Component Value Date/Time   CHOL 201 (H) 03/27/2016 0303   TRIG 73 03/27/2016 0303   HDL 45 03/27/2016 0303   CHOLHDL 4.5 03/27/2016 0303   VLDL 15 03/27/2016 0303   LDLCALC 141 (H) 03/27/2016 0303   HgbA1c: No  results found for: HGBA1C Urine Drug Screen: No results found for: LABOPIA, COCAINSCRNUR, LABBENZ, AMPHETMU, THCU, LABBARB    IMAGING  Mr Maxine GlennMra Head Wo Contrast  Result Date: 03/27/2016 CLINICAL DATA:  Right facial numbness and right arm weakness EXAM: MRI HEAD WITHOUT CONTRAST MRA HEAD WITHOUT CONTRAST TECHNIQUE: Multiplanar, multiecho pulse sequences of the brain and surrounding structures were obtained without intravenous contrast. Angiographic images of the head were obtained using MRA technique without contrast. COMPARISON:  None. FINDINGS: MRI HEAD FINDINGS Brain: There is a small focus of diffusion restriction at the base of the left precentral gyrus, near the right hand motor area. No evidence of acute hemorrhage. There is mild multifocal hyperintense T2-weighted signal within the periventricular and deep white matter, most often seen in the setting of chronic microvascular ischemia. No mass lesion or midline shift. No hydrocephalus or extra-axial fluid collection. No age advanced or lobar predominant atrophy. The midline structures are normal. Vascular: Major intracranial arterial and venous sinus flow voids are preserved. No evidence of chronic microhemorrhage or amyloid angiopathy. Skull and upper cervical spine: The visualized skull base, calvarium, upper cervical spine and extracranial soft tissues are normal. Sinuses/Orbits: No fluid levels or advanced mucosal thickening. No mastoid effusion. Normal orbits. MRA HEAD FINDINGS Intracranial internal carotid arteries:  There is a small outpouching of the communicating segment of the left internal carotid artery, likely a small infundibulum. Otherwise, the intracranial internal carotid arteries are normal. Anterior cerebral arteries: Normal. Middle cerebral arteries: Normal. Posterior communicating arteries: Not visualized Posterior cerebral arteries: Normal. Basilar artery: Normal. Vertebral arteries: Left dominant. Normal. Superior cerebellar  arteries: Normal. Anterior inferior cerebellar arteries: Normal on the right, not seen on the left. Posterior inferior cerebellar arteries: Normal on the left, not seen on the right. IMPRESSION: 1. Small area of acute ischemia at base of the left precentral gyrus, in keeping with the reported right-sided symptoms. No hemorrhage or mass effect. 2. 2 mm outpouching from the posterior aspect of the communicating segment of the left ICA is favored to be a small vascular infundibulum over an aneurysm. 3. Otherwise normal intracranial MRI. Electronically Signed   By: Deatra Robinson M.D.   On: 03/27/2016 04:11   Mr Brain Wo Contrast  Result Date: 03/27/2016 CLINICAL DATA:  Right facial numbness and right arm weakness EXAM: MRI HEAD WITHOUT CONTRAST MRA HEAD WITHOUT CONTRAST TECHNIQUE: Multiplanar, multiecho pulse sequences of the brain and surrounding structures were obtained without intravenous contrast. Angiographic images of the head were obtained using MRA technique without contrast. COMPARISON:  None. FINDINGS: MRI HEAD FINDINGS Brain: There is a small focus of diffusion restriction at the base of the left precentral gyrus, near the right hand motor area. No evidence of acute hemorrhage. There is mild multifocal hyperintense T2-weighted signal within the periventricular and deep white matter, most often seen in the setting of chronic microvascular ischemia. No mass lesion or midline shift. No hydrocephalus or extra-axial fluid collection. No age advanced or lobar predominant atrophy. The midline structures are normal. Vascular: Major intracranial arterial and venous sinus flow voids are preserved. No evidence of chronic microhemorrhage or amyloid angiopathy. Skull and upper cervical spine: The visualized skull base, calvarium, upper cervical spine and extracranial soft tissues are normal. Sinuses/Orbits: No fluid levels or advanced mucosal thickening. No mastoid effusion. Normal orbits. MRA HEAD FINDINGS  Intracranial internal carotid arteries: There is a small outpouching of the communicating segment of the left internal carotid artery, likely a small infundibulum. Otherwise, the intracranial internal carotid arteries are normal. Anterior cerebral arteries: Normal. Middle cerebral arteries: Normal. Posterior communicating arteries: Not visualized Posterior cerebral arteries: Normal. Basilar artery: Normal. Vertebral arteries: Left dominant. Normal. Superior cerebellar arteries: Normal. Anterior inferior cerebellar arteries: Normal on the right, not seen on the left. Posterior inferior cerebellar arteries: Normal on the left, not seen on the right. IMPRESSION: 1. Small area of acute ischemia at base of the left precentral gyrus, in keeping with the reported right-sided symptoms. No hemorrhage or mass effect. 2. 2 mm outpouching from the posterior aspect of the communicating segment of the left ICA is favored to be a small vascular infundibulum over an aneurysm. 3. Otherwise normal intracranial MRI. Electronically Signed   By: Deatra Robinson M.D.   On: 03/27/2016 04:11   Ct Head Code Stroke W/o Cm  Result Date: 03/26/2016 CLINICAL DATA:  Code stroke. Right arm weakness and left facial droop EXAM: CT HEAD WITHOUT CONTRAST TECHNIQUE: Contiguous axial images were obtained from the base of the skull through the vertex without intravenous contrast. COMPARISON:  None. FINDINGS: Brain: No mass lesion, intraparenchymal hemorrhage or extra-axial collection. No evidence of acute cortical infarct. Brain parenchyma and CSF-containing spaces are normal for age. Vascular: No hyperdense vessel or unexpected calcification. Skull: Normal visualized skull base, calvarium and extracranial soft tissues.  Sinuses/Orbits: No sinus fluid levels or advanced mucosal thickening. No mastoid effusion. Normal orbits. ASPECTS [facility]([place] Stroke Program Early CT Score) - Ganglionic level infarction (caudate, lentiform nuclei, internal capsule,  insula, M1-M3 cortex): 7 - Supraganglionic infarction (M4-M6 cortex): 3 Total score (0-10 with 10 being normal): 10 IMPRESSION: 1. Normal head CT. 2. ASPECTS is 10. These results were called by telephone at the time of interpretation on 03/26/2016 at 10:48 pm to Dr. Noel Christmasharles Stewart, who verbally acknowledged these results. Electronically Signed   By: Deatra RobinsonKevin  Herman M.D.   On: 03/26/2016 22:48     PHYSICAL EXAM Pleasant middle aged male not in distress. . Afebrile. Head is nontraumatic. Neck is supple without bruit.    Cardiac exam no murmur or gallop. Lungs are clear to auscultation. Distal pulses are well felt. Neurological Exam ;  Awake  Alert oriented x 3. Normal speech and language.eye movements full without nystagmus.fundi were not visualized. Vision acuity and fields appear normal. Hearing is normal. Palatal movements are normal. Face symmetric. Tongue midline. Normal strength, tone, reflexes and coordination. Diminished fine finger movements on the right. Orbits left over right upper extremity. Normal sensation. Gait deferred.  ASSESSMENT/PLAN Jeffery Small is a 55 y.o. male with history of HTN and  hypercholesterolemia presenting with R face numbness and R arm weaknesss. He did not receive IV t-PA due to rapidly improving symptoms.   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 Yes; Fluid consistency: Thin  aspirin 81 mg daily prior to admission per pt, now on aspirin 325 mg daily. Change to plavix. Continue at discharge.  Patient counseled to be compliant with his antithrombotic medications  Ongoing aggressive stroke risk factor management  Therapy recommendations:   OP OT  Disposition:  Return home  Hypertensive Emergency  BP as high as 206/106  Lowered this am 130-140s Permissive hypertension (OK if < 220/120) but gradually normalize in 5-7 days Long-term BP goal normotensive  Hyperlipidemia  Home meds:  No statin  LDL 141, goal < 70  Now on statin, lipitor 40 mg daily  Dr. Pearlean BrownieSethi addressed diet education options with pt and family  Continue statin at discharge  Other Stroke Risk Factors  Former Cigarette smoker  Obesity, Body mass index is 32.84 kg/m., recommend weight loss, diet and exercise as appropriate   Hospital day # 0  Rhoderick MoodyBIBY,SHARON  [facility] See Amion for Pager information 03/27/2016 11:07 AM  I have personally examined this patient, reviewed notes, independently viewed imaging studies, participated in medical decision making and plan of care.ROS completed by me personally and pertinent positives fully documented  I have made any additions or clarifications directly to the above note. Agree with note above. He presented with transient right arm weakness and right face arm paresthesias due to small left brain, infarct. He remains at risk for recurrent stroke, TIA needs stroke risk stratification. Recommend change aspirin to Plavix for stroke prevention and continue ongoing stroke workup. Greater than 50% time during this 35 minute visit was spent on counseling and coordination of care about stroke risk, prevention and treatment. Long discussion at the bedside with the patient, daughter and  son-in-law and answered questions.  Delia Heady, MD Medical Director [facility] Pager: [phone] 03/27/2016 12:34 PM  To contact Stroke Continuity provider, please refer to [email or web address]. After hours, contact General Neurology

## 2016-03-27 NOTE — Progress Notes (Signed)
OT Cancellation Note  Patient Details Name: Jeffery Small MRN: [id number] DOB: May 30, 1960   Cancelled Treatment:    Reason Eval/Treat Not Completed: Patient at procedure or test/ unavailable (vascular lab)  Evern BioMayberry, [name] 03/27/2016, 3:46 PM

## 2016-03-27 NOTE — Progress Notes (Signed)
Arrived from Ed at 0000. Alert and oriented. Denies any pain. Numbness on Rt fingers and rt arm ataxia. Family at bedside. Safety measures in place

## 2016-03-27 NOTE — Evaluation (Signed)
Physical Therapy Evaluation Patient Details Name: Jeffery Small MRN: [id number] DOB: 21-Feb-1961 Today's Date: 03/27/2016   History of Present Illness  Pt is a 55 y/o male who presents s/p R face numbness and RUE weakness. Pt presented to the ED via private vehicle as he declined EMS services. CT negative, MRI pending.   Clinical Impression  Patient evaluated by Physical Therapy with no further acute PT needs identified. All education has been completed and the patient has no further questions. At the time of PT eval pt was at a modified independent to independent level with all transfers and ambulation. Pt completed stair training and did not demonstrate any LOB or unsteadiness. Pt with RUE symptoms only and feels they are improving from yesterday. Continues to demonstrate some decreased coordination, and ulnar nerve distribution sensory deficits that would be appropriate for outpatient PT/OT to address. See below for any follow-up Physical Therapy or equipment needs. PT is signing off. Thank you for this referral.   Follow Up Recommendations Outpatient PT (vs. outpatient OT for UE symptoms)    Equipment Recommendations  None recommended by PT    Recommendations for Other Services       Precautions / Restrictions Precautions Precautions: None Restrictions Weight Bearing Restrictions: No      Mobility  Bed Mobility Overal bed mobility: Independent                Transfers Overall transfer level: Independent Equipment used: None                Ambulation/Gait Ambulation/Gait assistance: Modified independent (Device/Increase time) Ambulation Distance (Feet): 500 Feet Assistive device: None Gait Pattern/deviations: WFL(Within Functional Limits)   Gait velocity interpretation: at or above normal speed for age/gender General Gait Details: Somewhat slowed initially however pt was able to increase gait speed to his reported normal by end of gait training. No unsteadiness  or LOB noted.   Stairs Stairs: Yes Stairs assistance: Modified independent (Device/Increase time) Stair Management: Two rails;Alternating pattern;Forwards Number of Stairs: 5 General stair comments: No cueing required. Pt was able to complete without difficulty.   Wheelchair Mobility    Modified Rankin (Stroke Patients Only) Modified Rankin (Stroke Patients Only) Pre-Morbid Rankin Score: No symptoms Modified Rankin: No significant disability     Balance Overall balance assessment: No apparent balance deficits (not formally assessed)                                           Pertinent Vitals/Pain Pain Assessment: No/denies pain    Home Living Family/patient expects to be discharged to:: Private residence Living Arrangements: Children Available Help at Discharge: Family;Available 24 hours/day Type of Home: Mobile home Home Access: Stairs to enter Entrance Stairs-Rails: Right;Left;Can reach both Entrance Stairs-Number of Steps: 4 Home Layout: One level Home Equipment: Walker - 2 wheels;Cane - single point;Bedside commode      Prior Function Level of Independence: Independent         Comments: 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    RUE Sensation: decreased light touch (Hand)     Lower Extremity Assessment: Overall WFL for tasks assessed      Cervical /  Trunk Assessment: Normal  Communication   Communication: No difficulties  Cognition Arousal/Alertness: Awake/alert Behavior During Therapy: WFL for tasks assessed/performed Overall Cognitive Status: Within Functional Limits for tasks assessed                      General Comments      Exercises     Assessment/Plan    PT Assessment Patent  does not need any further PT services  PT Problem List            PT Treatment Interventions      PT Goals (Current goals can be found in the Care Plan section)  Acute Rehab PT Goals Patient Stated Goal: Home today PT Goal Formulation: All assessment and education complete, DC therapy    Frequency     Barriers to discharge        Co-evaluation               End of Session Equipment Utilized During Treatment: Gait belt Activity Tolerance: Patient tolerated treatment well Patient left: in chair;with call bell/phone within reach Nurse Communication: Mobility status         Time: [id number] PT Time Calculation (min) (ACUTE ONLY): 22 min   Charges:   PT Evaluation $PT Eval Low Complexity: 1 Procedure     PT G CodesMarylynn Small:        Jeffery Small 03/27/2016, 10:02 AM   Jeffery Small, PT, DPT Acute Rehabilitation Services Pager: [phone]

## 2016-03-27 NOTE — Care Management Note (Signed)
Case Management Note  Patient Details  Name: Jeffery Small MRN: [id number] Date of Birth: [birth year]  Subjective/Objective:                    Action/Plan: Patient discharging home with CM consult for outpatient therapy. CM met with the patient and he was interested in attending outpatient rehab at [facility]. Orders placed in EPIC and information on the AVS.   Expected Discharge Date:                  Expected Discharge Plan:  Home/Self Care  In-House Referral:     Discharge planning Services  CM Consult  Post Acute Care Choice:    Choice offered to:     DME Arranged:    DME Agency:     HH Arranged:    [place] Agency:     Status of Service:  Completed, signed off  If discussed at [employer] of Stay Meetings, dates discussed:    Additional Comments:  Pollie Friar, RN 03/27/2016, 4:17 PM

## 2016-03-27 NOTE — Progress Notes (Signed)
  Echocardiogram 2D Echocardiogram has been performed.  Janalyn HarderWest, Basheer Molchan R 03/27/2016, 10:01 AM

## 2016-03-28 LAB — HEMOGLOBIN A1C
HEMOGLOBIN A1C: 5.3 % (ref 4.8–5.6)
Mean Plasma Glucose: 105 mg/dL

## 2016-04-03 ENCOUNTER — Encounter: Payer: Self-pay | Admitting: Occupational Therapy

## 2016-04-03 ENCOUNTER — Ambulatory Visit: Payer: Managed Care, Other (non HMO) | Attending: Internal Medicine | Admitting: Occupational Therapy

## 2016-04-03 DIAGNOSIS — I639 Cerebral infarction, unspecified: Secondary | ICD-10-CM | POA: Insufficient documentation

## 2016-04-03 NOTE — Therapy (Signed)
[facility] [address] [place], [place], [address] Phone: [phone]   Fax:  [phone]  Occupational Therapy Evaluation/Discharge Summary  Patient Details  Name: Jeffery Small MRN: [id number] Date of Birth: Jun 06, 1960 Referring Provider: Lacie ScottsNiemeyer  Encounter Date: 04/03/2016      OT End of Session - 04/03/16 1359    Visit Number 1   Number of Visits 1   OT Start Time 1300   OT Stop Time 1335   OT Time Calculation (min) 35 min   Activity Tolerance Patient tolerated treatment well   Behavior During Therapy [facility] for tasks assessed/performed      Past Medical History:  Diagnosis Date  . Hypercholesterolemia     Past Surgical History:  Procedure Laterality Date  . VASECTOMY      There were no vitals filed for this visit.      Subjective Assessment - 04/03/16 1351    Subjective  Patient reports he had a stroke last week but now feels back to normal, had some issues initially with his right hand with coordination but now feels it has resolved.    Pertinent History Patient reports he was at home last Monday and ate dinner, his lips went numb and he had a hard time using his right hand to reach for his cup.  [facility].  He was discharged the next day.    Patient Stated Goals Patient reports he wants to do everything he did before, feels he is now back to his baseline level of function.   Currently in Pain? No/denies   Multiple Pain Sites No           [facility] OT Assessment - 04/03/16 1311      Assessment   Diagnosis CVA   Referring Provider Lacie Scottsiemeyer   Onset Date 03/26/16   Prior Therapy none     Precautions   Precautions None     Balance Screen   Has the patient fallen in the past 6 months No   Has the patient had a decrease in activity level because of a fear of falling?  No   Is the patient reluctant to leave their home because of a fear of falling?  No      Home  Environment   Family/patient expects to be discharged to: Private residence   Living Arrangements Children   Available Help at Discharge Family   Type of Home Mobile home   Home Access Stairs   Home Layout One level   Alternate Level Stairs - Number of Steps 6 to enter home   Bathroom Shower/Tub Tub/Shower unit;Curtain   Shower/tub characteristics Curtain   Restaurant manager, fast foodBathroom Toilet Standard   Home Equipment None   Lives With Family     Prior Function   Level of Independence Independent   Vocation Full time employment   Theme park managerVocation Requirements Concession supervisor at [employer]     ADL   Eating/Feeding Independent   Grooming Independent   ComptrollerUpper Body Bathing Independent   Lower Body Bathing Independent   Upper Body Dressing Independent   Lower Body Dressing Independent   Toilet Tranfer Independent   Toileting - Conservator, museum/galleryClothing Manipulation Independent   Toileting -  Tour managerHygiene Independent   Tub/Shower Transfer Independent   Transfers/Ambulation Related to ADL's independent   ADL comments Patient reports he feels he will be able to go back to his job and perform all of his job duties as  he did before.      IADL   Prior Level of Function Shopping independent   Shopping Takes care of all shopping needs independently   Prior Level of Function Light Housekeeping independent   Light Housekeeping Maintains house alone or with occasional assistance   Prior Level of Function Meal Prep independent   Meal Prep Plans, prepares and serves adequate meals independently   Prior Level of Function Chartered loss adjuster own vehicle   Prior Level of Function Meal Prep independent   Medication Management Is responsible for taking medication in correct dosages at correct time   Prior Level of Function Designer, fashion/clothing financial matters independently (budgets, writes checks, pays rent, bills goes to bank), collects and  keeps track of income     Mobility   Mobility Status Independent     Written Expression   Dominant Hand Right   Handwriting 90% legible     Vision - History   Baseline Vision Wears glasses only for reading     Cognition   Overall Cognitive Status Within Functional Limits for tasks assessed   Memory Appears intact     Sensation   Light Touch Appears Intact   Stereognosis Appears Intact   Hot/Cold Appears Intact   Proprioception Appears Intact     Coordination   Gross Motor Movements are Fluid and Coordinated Yes   Fine Motor Movements are Fluid and Coordinated Yes   Finger Nose Finger Test intact   9 Hole Peg Test Right;Left   Right 9 Hole Peg Test 25 sec   Left 9 Hole Peg Test 26 sec     ROM / Strength   AROM / PROM / Strength AROM;Strength     AROM   Overall AROM  Within functional limits for tasks performed     Strength   Overall Strength Within functional limits for tasks performed     Hand Function   Right Hand Grip (lbs) 75   Right Hand Lateral Pinch 20 lbs   Right Hand 3 Point Pinch 21 lbs   Left Hand Grip (lbs) 73   Left Hand Lateral Pinch 22 lbs   Left 3 point pinch 20 lbs                         OT Education - 04/03/16 1359    Education provided Yes   Education Details role of OT   Person(s) Educated Patient   Methods Explanation   Comprehension Verbalized understanding                    Plan - 04/03/16 1400    Clinical Impression Statement Patient is a 55 yo male who was diagnosed with a 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  He reports he initially had some right sided weakness and decrease in coordination skills however feels all his symptoms have resolved and he feels back to his baseline level of functioning.  Patient was evaluated by OT and is able to complete all daily tasks independently, no muscle weakness and no  deficits in coordination at the time of his evaluation.  He does not require skilled OT intervention at this time, evaluation only.   Rehab Potential Excellent   OT Frequency One time visit   Consulted and Agree with Plan of Care Patient  Patient will benefit from skilled therapeutic intervention in order to improve the following deficits and impairments:     Visit Diagnosis: Cerebrovascular accident (CVA), unspecified mechanism (HCC) - Plan: Ot plan of care cert/re-cert    Problem List Patient Active Problem List   Diagnosis Date Noted  . CVA (cerebral vascular accident) (HCC) 03/27/2016  . Speech abnormality 03/26/2016  . Stroke-like symptoms 03/26/2016   Kerrie BuffaloAmy T Seila Liston, OTR/L, CLT  Tal Kempker 04/03/2016, 2:09 PM  [facility] [address] [place], [place], [address] Phone: [phone]   Fax:  [phone]  Name: Jeffery Small MRN: [id number] Date of Birth: 1960-10-25

## 2016-04-04 ENCOUNTER — Telehealth: Payer: Self-pay | Admitting: *Deleted

## 2016-04-04 NOTE — Telephone Encounter (Signed)
LVM requesting patient call back to reschedule hospital FU for stroke. Advised him Dr Marjory LiesPenumalli will be out of the office when he is currently scheduled to come. Left name, number.

## 2016-04-04 NOTE — Telephone Encounter (Signed)
This RN noticed pt was rescheduled with Dr Marjory LiesPenumalli on 05/11/16 when he will be out of office. This RN called patient and apologized, rescheduled him with Dr Pearlean BrownieSethi who saw him on stroke unit in hospital. Patient stated he has seen his PCP and will get repeat labs on 05/08/16. He verbalized understanding.

## 2016-04-06 ENCOUNTER — Ambulatory Visit: Payer: Managed Care, Other (non HMO)

## 2016-04-06 ENCOUNTER — Encounter: Payer: Managed Care, Other (non HMO) | Admitting: Occupational Therapy

## 2016-04-09 ENCOUNTER — Encounter: Payer: Managed Care, Other (non HMO) | Admitting: Occupational Therapy

## 2016-04-11 ENCOUNTER — Ambulatory Visit: Payer: Managed Care, Other (non HMO) | Admitting: Physical Therapy

## 2016-04-11 ENCOUNTER — Encounter: Payer: Managed Care, Other (non HMO) | Admitting: Occupational Therapy

## 2016-04-16 ENCOUNTER — Ambulatory Visit: Payer: Managed Care, Other (non HMO)

## 2016-04-16 ENCOUNTER — Encounter: Payer: Managed Care, Other (non HMO) | Admitting: Occupational Therapy

## 2016-04-18 ENCOUNTER — Ambulatory Visit: Payer: Managed Care, Other (non HMO)

## 2016-04-18 ENCOUNTER — Encounter: Payer: Managed Care, Other (non HMO) | Admitting: Occupational Therapy

## 2016-04-26 ENCOUNTER — Ambulatory Visit: Payer: Managed Care, Other (non HMO)

## 2016-04-26 ENCOUNTER — Encounter: Payer: Managed Care, Other (non HMO) | Admitting: Occupational Therapy

## 2016-05-03 ENCOUNTER — Ambulatory Visit: Payer: Managed Care, Other (non HMO)

## 2016-05-03 ENCOUNTER — Encounter: Payer: Managed Care, Other (non HMO) | Admitting: Occupational Therapy

## 2016-05-08 ENCOUNTER — Ambulatory Visit: Payer: Managed Care, Other (non HMO)

## 2016-05-08 ENCOUNTER — Ambulatory Visit: Payer: Self-pay | Admitting: Diagnostic Neuroimaging

## 2016-05-08 ENCOUNTER — Encounter: Payer: Managed Care, Other (non HMO) | Admitting: Occupational Therapy

## 2016-05-10 ENCOUNTER — Encounter: Payer: Managed Care, Other (non HMO) | Admitting: Occupational Therapy

## 2016-05-10 ENCOUNTER — Ambulatory Visit: Payer: Managed Care, Other (non HMO)

## 2016-05-11 ENCOUNTER — Ambulatory Visit: Payer: Self-pay | Admitting: Diagnostic Neuroimaging

## 2016-05-15 ENCOUNTER — Ambulatory Visit: Payer: Managed Care, Other (non HMO)

## 2016-05-15 ENCOUNTER — Encounter: Payer: Managed Care, Other (non HMO) | Admitting: Occupational Therapy

## 2016-05-17 ENCOUNTER — Ambulatory Visit: Payer: Managed Care, Other (non HMO)

## 2016-05-17 ENCOUNTER — Ambulatory Visit: Payer: Self-pay | Admitting: Neurology

## 2016-05-17 ENCOUNTER — Encounter: Payer: Managed Care, Other (non HMO) | Admitting: Occupational Therapy

## 2016-05-24 ENCOUNTER — Telehealth: Payer: Self-pay

## 2016-05-24 NOTE — Telephone Encounter (Signed)
Rn call patient to r/s his hospital follow up from 05/17/2016 for stroke. Pt was schedule with Dr. Pearlean BrownieSethi on 05/17/2016 but was cancel due to office being closed. Rn offer to r/s pt. Pt stated he has seen his PCP  since being discharge from the hospital. He appreciate Dr.Sethi caring for him in the hospital. He is doing well. Rn advised pt that the hospital advised  pts to follow up with the neurologist once discharge to prevent future hospital visits. Pt stated he is doing well and does not need to follow up at this time. Rn advised pt to always call back to r/s if he has any questions concerns or having issues neurological or about his stroke.Pt verbalized understanding.

## 2018-06-11 IMAGING — MR MR HEAD W/O CM
9 of 12 series · 29 of 48 positions shown · non-contrast
Comparison: None.

CLINICAL DATA: Right facial numbness and right arm weakness

EXAM:
MRI HEAD WITHOUT CONTRAST
MRA HEAD WITHOUT CONTRAST
TECHNIQUE: Multiplanar, multiecho pulse sequences of the brain and surrounding
structures were obtained without intravenous contrast. Angiographic
images of the head were obtained using MRA technique without
contrast.

[Series 2: FLAIR · sagittal · 5.0mm · 0.47mm/px · 1 of 23 slices shown (1 of 2)]
[im 1/23]
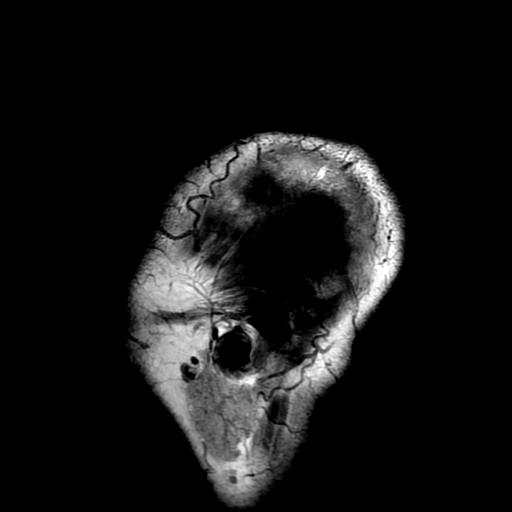

[Series 4: DWI · axial · 3.0mm · 0.94mm/px · z∈[-34,+113]mm · 6 of 100 slices shown (1 of 2)]
[im 1/100]
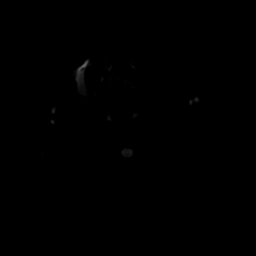
[im 20/100]
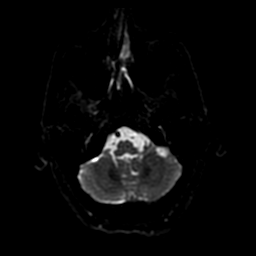
[im 40/100]
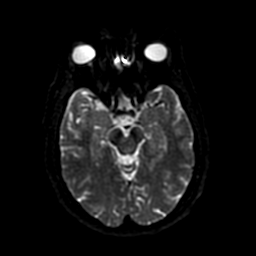
[im 60/100]
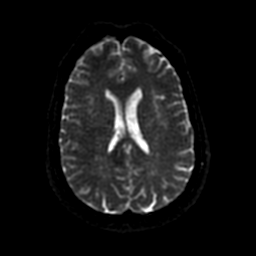
[im 80/100]
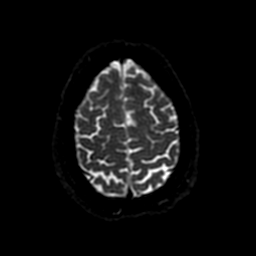
[im 100/100]
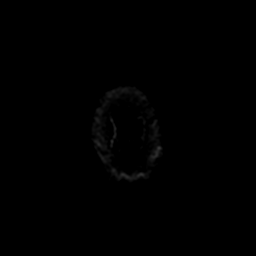

[Series 5: ax (id) 2 · axial · 1.0mm · 0.43mm/px · z∈[-38,+34]mm · 7 of 184 slices shown]
[im 1/184]
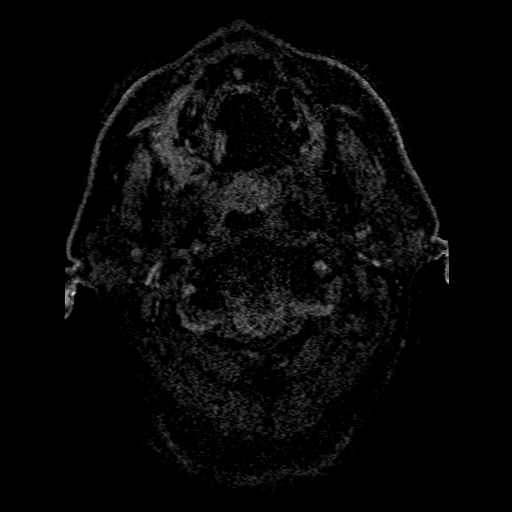
[im 37/184]
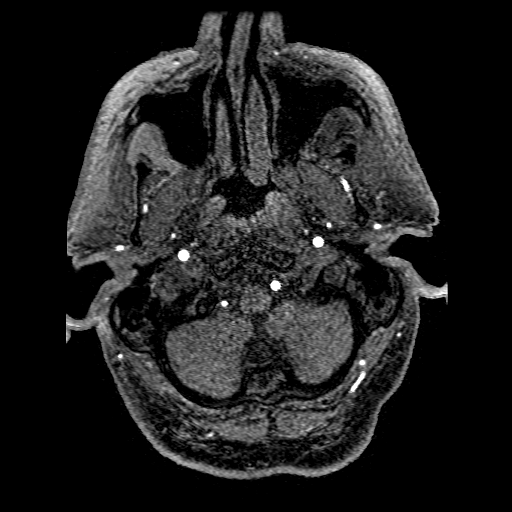
[im 55/184]
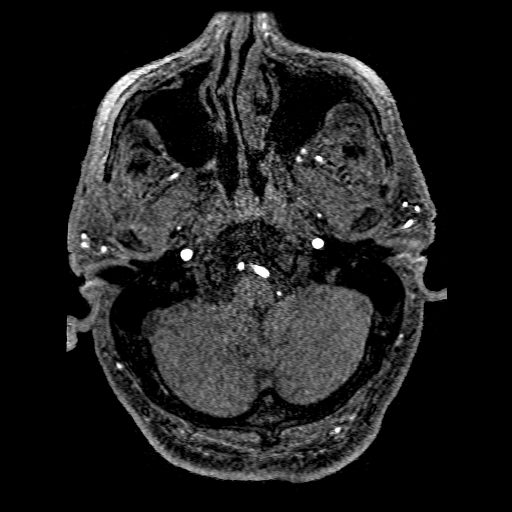
[im 74/184]
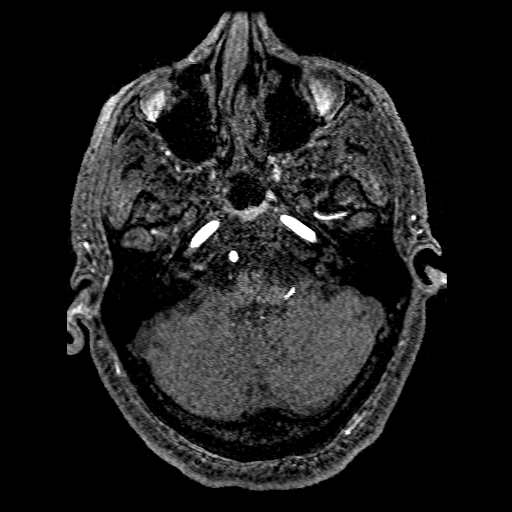
[im 110/184]
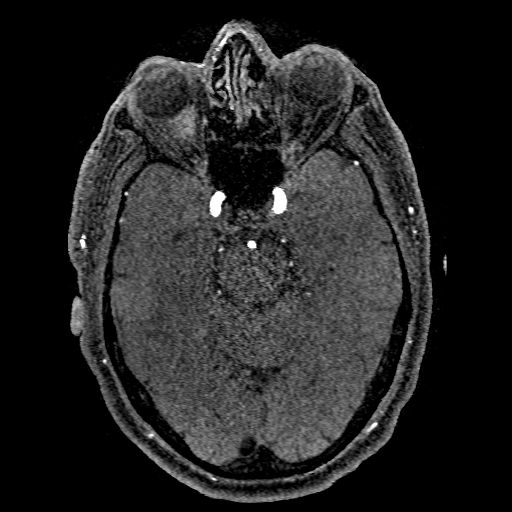
[im 129/184]
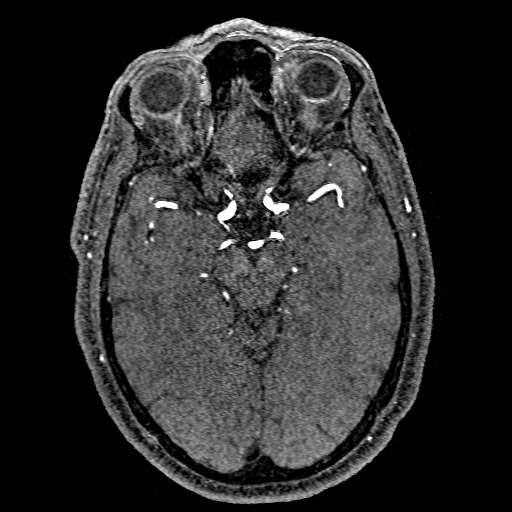
[im 147/184]
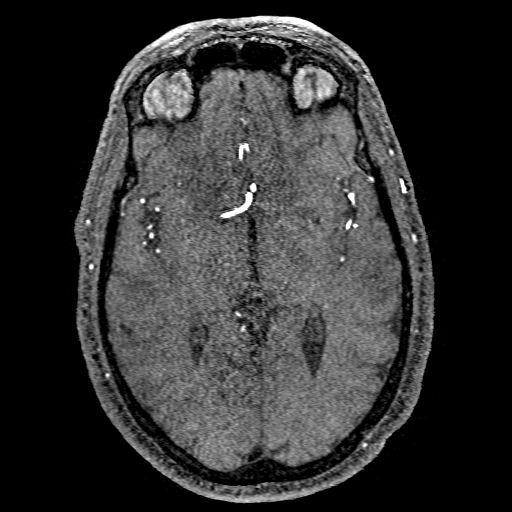

[Series 6: T2 · axial · 5.0mm · 0.47mm/px · z∈[-39,+116]mm · 2 of 27 slices shown]
[im 1/27]
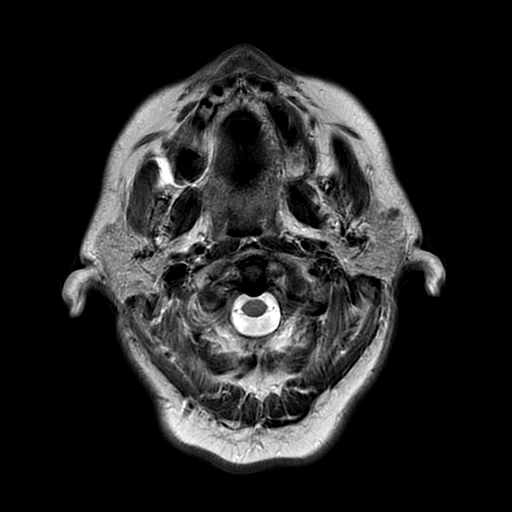
[im 27/27]
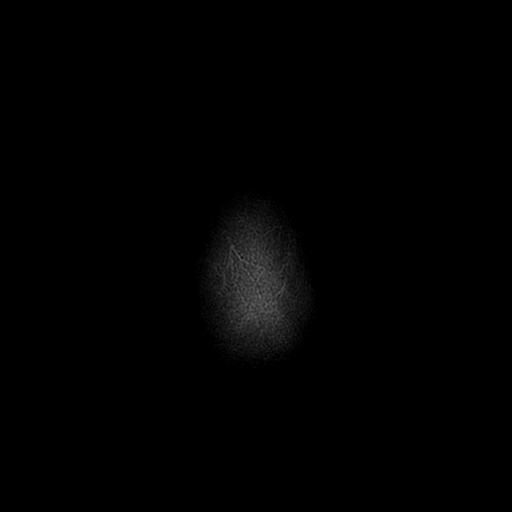

[Series 7: FLAIR · axial · 5.0mm · 0.47mm/px · z∈[-39,+116]mm · 2 of 27 slices shown (2 of 2)]
[im 1/27]
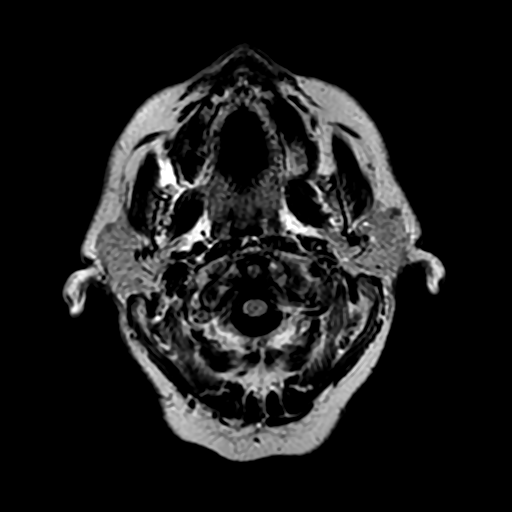
[im 27/27]
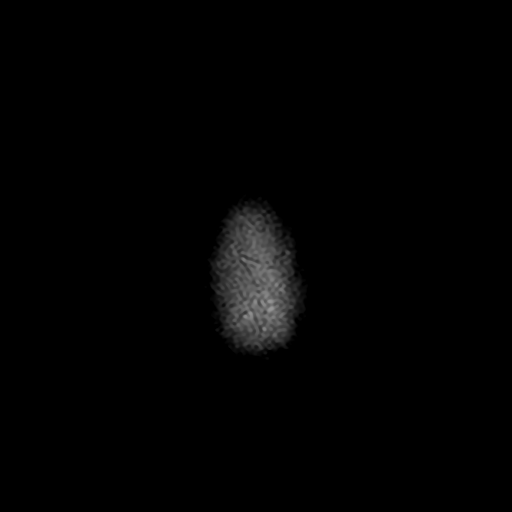

[Series 8: DWI · coronal · 4.0mm · 0.94mm/px · 4 of 72 slices shown (2 of 2)]
[im 1/72]
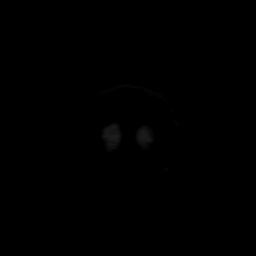
[im 24/72]
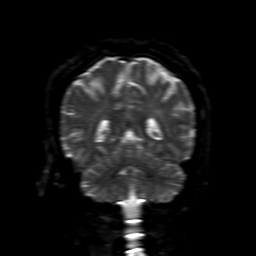
[im 48/72]
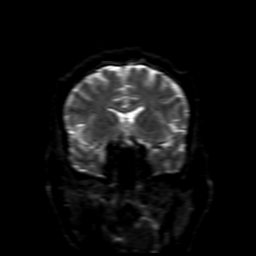
[im 72/72]
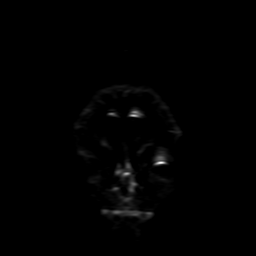

[Series 11: T2 post-contrast · coronal · 5.0mm · 0.39mm/px · 2 of 30 slices shown]
[im 1/30]
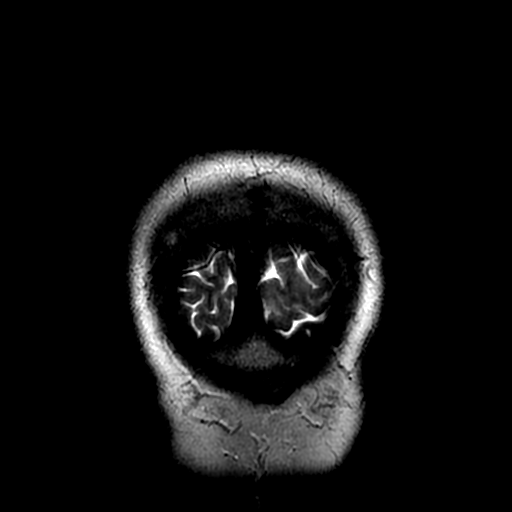
[im 30/30]
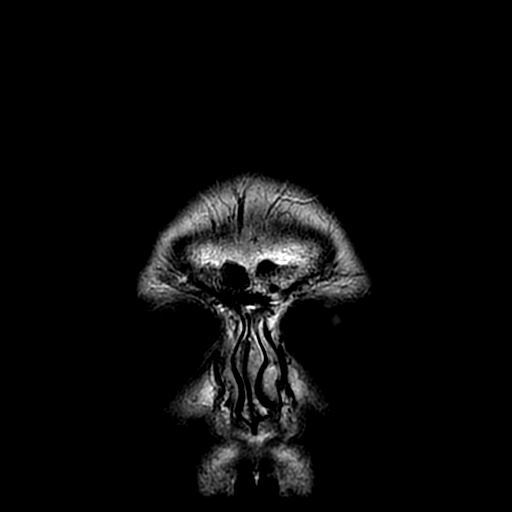

[Series 450: ADC · axial · 3.0mm · 0.94mm/px · z∈[-34,+113]mm · 3 of 49 slices shown (1 of 2)]
[im 1/49]
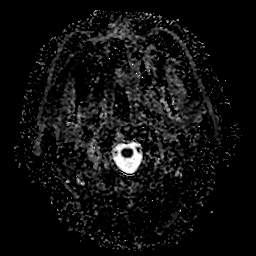
[im 25/49]
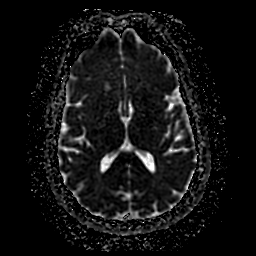
[im 49/49]
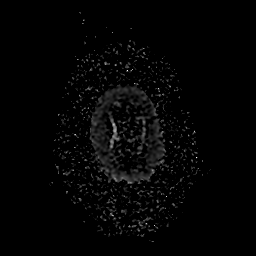

[Series 850: ADC · coronal · 4.0mm · 0.94mm/px · 2 of 36 slices shown (2 of 2)]
[im 1/36]
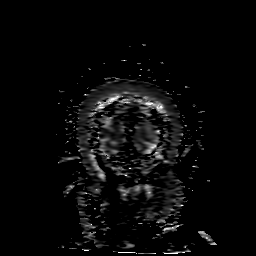
[im 36/36]
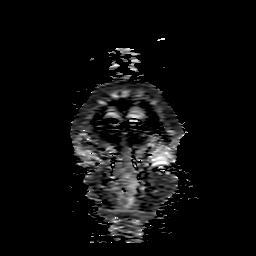

[29 of 48 positions shown; findings below may reference images not displayed]

FINDINGS: MRI HEAD FINDINGS

Brain: There is a small focus of diffusion restriction at the base
of the left precentral gyrus, near the right hand motor area. No
evidence of acute hemorrhage. There is mild multifocal hyperintense
T2-weighted signal within the periventricular and deep white matter,
most often seen in the setting of chronic microvascular ischemia. No
mass lesion or midline shift. No hydrocephalus or extra-axial fluid
collection. No age advanced or lobar predominant atrophy. The
midline structures are normal.

Vascular: Major intracranial arterial and venous sinus flow voids
are preserved. No evidence of chronic microhemorrhage or amyloid
angiopathy.

Skull and upper cervical spine: The visualized skull base,
calvarium, upper cervical spine and extracranial soft tissues are
normal.

Sinuses/Orbits: No fluid levels or advanced mucosal thickening. No
mastoid effusion. Normal orbits.

MRA HEAD FINDINGS

Intracranial internal carotid arteries: There is a small outpouching
of the communicating segment of the left internal carotid artery,
likely a small infundibulum. Otherwise, the intracranial internal
carotid arteries are normal.

Anterior cerebral arteries: Normal.

Middle cerebral arteries: Normal.

Posterior communicating arteries: Not visualized

Posterior cerebral arteries: Normal.

Basilar artery: Normal.

Vertebral arteries: Left dominant. Normal.

Superior cerebellar arteries: Normal.

Anterior inferior cerebellar arteries: Normal on the right, not seen
on the left.

Posterior inferior cerebellar arteries: Normal on the left, not seen
on the right.
IMPRESSION: 1. Small area of acute ischemia at base of the left precentral
gyrus, in keeping with the reported right-sided symptoms. No
hemorrhage or mass effect.
2. 2 mm outpouching from the posterior aspect of the communicating
segment of the left ICA is favored to be a small vascular
infundibulum over an aneurysm.
3. Otherwise normal intracranial MRI.

## 2023-03-21 ENCOUNTER — Ambulatory Visit: Payer: Medicaid Other | Admitting: Nurse Practitioner

## 2023-03-21 ENCOUNTER — Encounter: Payer: Self-pay | Admitting: Nurse Practitioner

## 2023-03-21 VITALS — BP 136/100 | HR 77 | Temp 97.8°F | Ht 71.5 in | Wt 265.4 lb

## 2023-03-21 DIAGNOSIS — I10 Essential (primary) hypertension: Secondary | ICD-10-CM | POA: Insufficient documentation

## 2023-03-21 DIAGNOSIS — Z125 Encounter for screening for malignant neoplasm of prostate: Secondary | ICD-10-CM | POA: Diagnosis not present

## 2023-03-21 DIAGNOSIS — E669 Obesity, unspecified: Secondary | ICD-10-CM | POA: Diagnosis not present

## 2023-03-21 DIAGNOSIS — R000 Tachycardia, unspecified: Secondary | ICD-10-CM | POA: Diagnosis not present

## 2023-03-21 DIAGNOSIS — E785 Hyperlipidemia, unspecified: Secondary | ICD-10-CM | POA: Diagnosis not present

## 2023-03-21 DIAGNOSIS — I4891 Unspecified atrial fibrillation: Secondary | ICD-10-CM | POA: Diagnosis not present

## 2023-03-21 DIAGNOSIS — Z6836 Body mass index (BMI) 36.0-36.9, adult: Secondary | ICD-10-CM

## 2023-03-21 DIAGNOSIS — Z9109 Other allergy status, other than to drugs and biological substances: Secondary | ICD-10-CM | POA: Insufficient documentation

## 2023-03-21 DIAGNOSIS — Z8673 Personal history of transient ischemic attack (TIA), and cerebral infarction without residual deficits: Secondary | ICD-10-CM | POA: Insufficient documentation

## 2023-03-21 DIAGNOSIS — Z1211 Encounter for screening for malignant neoplasm of colon: Secondary | ICD-10-CM

## 2023-03-21 LAB — LIPID PANEL
Cholesterol: 147 mg/dL (ref 0–200)
HDL: 39.6 mg/dL (ref >39.00)
LDL Cholesterol: 51 mg/dL (ref 0–99)
NonHDL: 107.68
Total CHOL/HDL Ratio: 4
Triglycerides: 283 mg/dL — ABNORMAL HIGH (ref 0.0–149.0)
VLDL: 56.6 mg/dL — ABNORMAL HIGH (ref 0.0–40.0)

## 2023-03-21 LAB — COMPREHENSIVE METABOLIC PANEL
ALT: 29 U/L (ref 0–53)
AST: 21 U/L (ref 0–37)
Albumin: 4.5 g/dL (ref 3.5–5.2)
Alkaline Phosphatase: 88 U/L (ref 39–117)
BUN: 17 mg/dL (ref 6–23)
CO2: 24 meq/L (ref 19–32)
Calcium: 9.9 mg/dL (ref 8.4–10.5)
Chloride: 105 meq/L (ref 96–112)
Creatinine, Ser: 1.01 mg/dL (ref 0.40–1.50)
GFR: 78.95 mL/min (ref >60.00)
Glucose, Bld: 89 mg/dL (ref 70–99)
Potassium: 4.6 meq/L (ref 3.5–5.1)
Sodium: 139 meq/L (ref 135–145)
Total Bilirubin: 0.6 mg/dL (ref 0.2–1.2)
Total Protein: 6.9 g/dL (ref 6.0–8.3)

## 2023-03-21 LAB — PSA: PSA: 0.37 ng/mL (ref 0.10–4.00)

## 2023-03-21 LAB — CBC
HCT: 48.1 % (ref 39.0–52.0)
Hemoglobin: 16.4 g/dL (ref 13.0–17.0)
MCHC: 34 g/dL (ref 30.0–36.0)
MCV: 89 fL (ref 78.0–100.0)
Platelets: 284 10*3/uL (ref 150.0–400.0)
RBC: 5.4 Mil/uL (ref 4.22–5.81)
RDW: 13.1 % (ref 11.5–15.5)
WBC: 8.5 10*3/uL (ref 4.0–10.5)

## 2023-03-21 LAB — TSH: TSH: 1.79 u[IU]/mL (ref 0.35–5.50)

## 2023-03-21 LAB — HEMOGLOBIN A1C: Hgb A1c MFr Bld: 6 % (ref 4.6–6.5)

## 2023-03-21 MED ORDER — CLOPIDOGREL BISULFATE 75 MG PO TABS: 75.0000 mg | ORAL_TABLET | Freq: Every day | ORAL | 3 refills | Status: DC

## 2023-03-21 MED ORDER — CLOPIDOGREL BISULFATE 75 MG PO TABS
75.0000 mg | ORAL_TABLET | Freq: Every day | ORAL | 3 refills | Status: DC
Start: 2023-03-21 — End: 2023-04-12

## 2023-03-21 MED ORDER — APIXABAN 5 MG PO TABS: 5.0000 mg | ORAL_TABLET | Freq: Two times a day (BID) | ORAL | 1 refills | Status: DC

## 2023-03-21 NOTE — Assessment & Plan Note (Signed)
Incidental finding on exam 1 patient was seen tachycardic on auscultation EKG showed atrial fibrillation with a rate of 142.  Patient currently on beta-blocker low-dose with history of stroke.  Increase metoprolol from 25 mg to 50 mg daily patient placed on Eliquis 5 mg twice daily with a CHA2DS2-VASc score of 4.  Ambulatory referral to cardiology also.  Signs and symptoms reviewed when to seek urgent emergent healthcare.  Patient have close follow-up with on Monday.  Patient was encouraged to get a pulse ox to check pulse over the weekend

## 2023-03-21 NOTE — Patient Instructions (Addendum)
Nice to see you today I want to see you on Monday for a recheck I am going to increase the metoprolol form 25mg  daily to 50mg  daily (take 2 tablets) STOP the baby aspirin and start the Eliquis.  I have referred you to the heart doctor, they will reach out to you   If you can get a pulse ox at home and monitor your pulse. I want you between 60-110 beats per minute

## 2023-03-21 NOTE — Assessment & Plan Note (Signed)
Patient currently uses Flonase as needed and Singulair daily.  Continue

## 2023-03-21 NOTE — Assessment & Plan Note (Signed)
EKG in office resulted in A-fib

## 2023-03-21 NOTE — Assessment & Plan Note (Signed)
Patient currently on atorvastatin and clopidogrel.  Will discontinue aspirin in setting of starting anticoagulation

## 2023-03-21 NOTE — Assessment & Plan Note (Signed)
Patient currently maintained on losartan 100 mg daily along with metoprolol 25 mg daily blood pressure above goal in the setting of new A-fib will increase metoprolol to 50 mg daily patient to take 2 of the 25 mg tablets

## 2023-03-21 NOTE — Assessment & Plan Note (Signed)
Patient currently maintained on atorvastatin continue

## 2023-03-21 NOTE — Assessment & Plan Note (Signed)
Pending TSH, A1c, lipid panel.

## 2023-03-21 NOTE — Progress Notes (Signed)
New Patient Office Visit  Subjective    Patient ID: Jeffery Small, male    DOB: 1961-02-23  Age: 62 y.o. MRN: [id number]  CC:  Chief Complaint  Patient presents with   Establish Care   Medication Refill    Clopidogrel     HPI Jeffery Small presents to establish care  Dr Jerald Kief through [facility]. Last office visit approx 6 months   CVA: on asa and clopidigeral. States that he is having no residuals.   HLD: on atorvastatin tolerating well  HTN: Does not have a cuff at home. Does well on the losartan and metoprool and does it in the evening  Allergies: flonase if he needs and on singular daily   Colonoscopy: amb referral to [place]  PSA: Due  Tdap: 2 weeks ago Flu vaccine: utd PNA; UTD Shingles: UTD -coid: original and boosters      Outpatient Encounter Medications as of 03/21/2023  Medication Sig   apixaban (ELIQUIS) 5 MG TABS tablet Take 1 tablet (5 mg total) by mouth 2 (two) times daily.   Ascorbic Acid (VITAMIN C) 1000 MG tablet Take 1,000 mg by mouth daily.   atorvastatin (LIPITOR) 40 MG tablet Take 1 tablet (40 mg total) by mouth daily at 6 PM.   fluticasone (FLONASE) 50 MCG/ACT nasal spray Place 2 sprays into both nostrils daily as needed for allergies.    L-Lysine 1000 MG TABS Take by mouth.   losartan (COZAAR) 100 MG tablet Take 100 mg by mouth daily.   metoprolol succinate (TOPROL-XL) 25 MG 24 hr tablet Take 25 mg by mouth daily.   montelukast (SINGULAIR) 10 MG tablet Take 10 mg by mouth daily.   Multiple Vitamins-Minerals (MENS 50+ MULTIVITAMIN PO) Take by mouth.   [DISCONTINUED] aspirin 81 MG chewable tablet Chew by mouth daily.   [DISCONTINUED] clopidogrel (PLAVIX) 75 MG tablet Take 1 tablet (75 mg total) by mouth daily.   clopidogrel (PLAVIX) 75 MG tablet Take 1 tablet (75 mg total) by mouth daily.   [DISCONTINUED] clopidogrel (PLAVIX) 75 MG tablet Take 1 tablet (75 mg total) by mouth daily.   No facility-administered encounter medications on  file as of 03/21/2023.    Past Medical History:  Diagnosis Date   Hypercholesterolemia    Hypertension     Past Surgical History:  Procedure Laterality Date   VASECTOMY      Family History  Problem Relation Age of Onset   Suicidality Father    Hypertension Paternal Grandmother    Hypertension Paternal Grandfather     Social History   Socioeconomic History   Marital status: Divorced    Spouse name: Not on file   Number of children: 2   Years of education: Not on file   Highest education level: Not on file  Occupational History   Not on file  Tobacco Use   Smoking status: Former    Types: Cigarettes    Start date: 2008    Quit date: 03/20/1993    Years since quitting: 30.0   Smokeless tobacco: Never  Vaping Use   Vaping status: Never Used  Substance and Sexual Activity   Alcohol use: No    Comment: Former EtOH use, quit four years ago.   Drug use: No   Sexual activity: Not on file  Other Topics Concern   Not on file  Social History Narrative   Retired Loss adjuster, chartered at [employer] [phone])   Revonda Standard ( 36)   Social  Determinants of Health   Financial Resource Strain: Not on file  Food Insecurity: Not on file  Transportation Needs: Not on file  Physical Activity: Not on file  Stress: Not on file  Social Connections: Not on file  Intimate Partner Violence: Not on file    Review of Systems  Constitutional:  Negative for chills and fever.  Respiratory:  Negative for shortness of breath.   Cardiovascular:  Negative for chest pain and leg swelling.  Gastrointestinal:  Negative for abdominal pain, blood in stool, constipation, diarrhea, nausea and vomiting.       BM daily   Genitourinary:  Negative for dysuria and hematuria.  Neurological:  Negative for tingling and headaches.  Psychiatric/Behavioral:  Negative for hallucinations and suicidal ideas.         Objective    BP (!) 136/100   Pulse 77   Temp 97.8 F (36.6 C) (Oral)   Ht 5' 11.5"  (1.816 m)   Wt 265 lb 6.4 oz (120.4 kg)   SpO2 96%   BMI 36.50 kg/m   Physical Exam Vitals and nursing note reviewed.  Constitutional:      Appearance: Normal appearance.  HENT:     Right Ear: Tympanic membrane, ear canal and external ear normal.     Left Ear: Tympanic membrane, ear canal and external ear normal.     Mouth/Throat:     Mouth: Mucous membranes are moist.     Pharynx: Oropharynx is clear.  Eyes:     Extraocular Movements: Extraocular movements intact.     Pupils: Pupils are equal, round, and reactive to light.  Cardiovascular:     Rate and Rhythm: Regular rhythm. Tachycardia present.     Pulses: Normal pulses.     Heart sounds: Normal heart sounds.  Pulmonary:     Effort: Pulmonary effort is normal.     Breath sounds: Normal breath sounds.  Musculoskeletal:     Right lower leg: No edema.     Left lower leg: No edema.  Lymphadenopathy:     Cervical: No cervical adenopathy.  Skin:    General: Skin is warm.  Neurological:     General: No focal deficit present.     Mental Status: He is alert.     Deep Tendon Reflexes:     Reflex Scores:      Bicep reflexes are 2+ on the right side and 2+ on the left side.      Patellar reflexes are 2+ on the right side and 2+ on the left side.    Comments: Bilateral upper and lower extremity strength 5/5  Psychiatric:        Mood and Affect: Mood normal.        Behavior: Behavior normal.        Thought Content: Thought content normal.        Judgment: Judgment normal.         Assessment & Plan:   Problem List Items Addressed This Visit       Cardiovascular and Mediastinum   Primary hypertension    Patient currently maintained on losartan 100 mg daily along with metoprolol 25 mg daily blood pressure above goal in the setting of new A-fib will increase metoprolol to 50 mg daily patient to take 2 of the 25 mg tablets      Relevant Medications   metoprolol succinate (TOPROL-XL) 25 MG 24 hr tablet   losartan  (COZAAR) 100 MG tablet   apixaban (ELIQUIS) 5 MG TABS  tablet   Other Relevant Orders   CBC   Comprehensive metabolic panel   TSH   Hemoglobin A1c   Atrial fibrillation (HCC)    Incidental finding on exam 1 patient was seen tachycardic on auscultation EKG showed atrial fibrillation with a rate of 142.  Patient currently on beta-blocker low-dose with history of stroke.  Increase metoprolol from 25 mg to 50 mg daily patient placed on Eliquis 5 mg twice daily with a CHA2DS2-VASc score of 4.  Ambulatory referral to cardiology also.  Signs and symptoms reviewed when to seek urgent emergent healthcare.  Patient have close follow-up with on Monday.  Patient was encouraged to get a pulse ox to check pulse over the weekend      Relevant Medications   metoprolol succinate (TOPROL-XL) 25 MG 24 hr tablet   losartan (COZAAR) 100 MG tablet   apixaban (ELIQUIS) 5 MG TABS tablet   Other Relevant Orders   Ambulatory referral to Cardiology     Other   Tachycardia - Primary    EKG in office resulted in A-fib      Relevant Orders   EKG 12-Lead (Completed)   CBC   Comprehensive metabolic panel   TSH   History of stroke    Patient currently on atorvastatin and clopidogrel.  Will discontinue aspirin in setting of starting anticoagulation      Relevant Medications   clopidogrel (PLAVIX) 75 MG tablet   Other Relevant Orders   Lipid panel   Hemoglobin A1c   Hyperlipidemia    Patient currently maintained on atorvastatin continue      Relevant Medications   metoprolol succinate (TOPROL-XL) 25 MG 24 hr tablet   losartan (COZAAR) 100 MG tablet   apixaban (ELIQUIS) 5 MG TABS tablet   clopidogrel (PLAVIX) 75 MG tablet   Other Relevant Orders   Lipid panel   Hemoglobin A1c   Environmental allergies    Patient currently uses Flonase as needed and Singulair daily.  Continue      Obesity (BMI 30-39.9)    Pending TSH, A1c, lipid panel.      Relevant Orders   Hemoglobin A1c   Other Visit  Diagnoses     Screening for colon cancer       Relevant Orders   Ambulatory referral to Gastroenterology   Screening for prostate cancer       Relevant Orders   PSA       Return in about 4 days (around 03/25/2023) for Afib/ HR recheck .   Audria Nine, NP

## 2023-03-25 ENCOUNTER — Ambulatory Visit: Payer: Medicaid Other | Admitting: Nurse Practitioner

## 2023-03-25 ENCOUNTER — Encounter: Payer: Self-pay | Admitting: Nurse Practitioner

## 2023-03-25 VITALS — BP 122/88 | HR 97 | Temp 97.8°F | Ht 71.5 in | Wt 266.8 lb

## 2023-03-25 DIAGNOSIS — Z8673 Personal history of transient ischemic attack (TIA), and cerebral infarction without residual deficits: Secondary | ICD-10-CM | POA: Diagnosis not present

## 2023-03-25 DIAGNOSIS — E785 Hyperlipidemia, unspecified: Secondary | ICD-10-CM

## 2023-03-25 DIAGNOSIS — Z9109 Other allergy status, other than to drugs and biological substances: Secondary | ICD-10-CM

## 2023-03-25 DIAGNOSIS — I10 Essential (primary) hypertension: Secondary | ICD-10-CM

## 2023-03-25 DIAGNOSIS — I4891 Unspecified atrial fibrillation: Secondary | ICD-10-CM

## 2023-03-25 MED ORDER — ATORVASTATIN CALCIUM 40 MG PO TABS: 40.0000 mg | ORAL_TABLET | Freq: Every day | ORAL | 0 refills | Status: DC

## 2023-03-25 MED ORDER — LOSARTAN POTASSIUM 100 MG PO TABS: 100.0000 mg | ORAL_TABLET | Freq: Every day | ORAL | 3 refills | Status: DC

## 2023-03-25 MED ORDER — APIXABAN 5 MG PO TABS: 5.0000 mg | ORAL_TABLET | Freq: Two times a day (BID) | ORAL | 1 refills | Status: DC

## 2023-03-25 MED ORDER — MONTELUKAST SODIUM 10 MG PO TABS: 10.0000 mg | ORAL_TABLET | Freq: Every day | ORAL | 3 refills | Status: DC

## 2023-03-25 MED ORDER — METOPROLOL SUCCINATE ER 50 MG PO TB24: 50.0000 mg | ORAL_TABLET | Freq: Every day | ORAL | 3 refills | Status: DC

## 2023-03-25 NOTE — Assessment & Plan Note (Signed)
Patient currently maintained on apixaban 5 mg twice daily metoprolol 50 mg once daily XL.  Patient's rhythm is still irregular but rate ranged anywhere from mid 70s to higher 90s.  Patient is currently asymptomatic.  Did review signs and symptoms when to be reevaluated we will continue medications as is encourage patient to follow-up with cardiology once the referral has been completed and he has an appointment scheduled.

## 2023-03-25 NOTE — Assessment & Plan Note (Signed)
Patient currently maintained on metoprolol 50 mg daily along with losartan 100 mg daily.  Blood pressure within normal limits.  Patient is tolerating both medications well continue medications as prescribed

## 2023-03-25 NOTE — Progress Notes (Signed)
Established Patient Office Visit  Subjective   Patient ID: Jeffery Small, male    DOB: Aug 02, 1960  Age: 62 y.o. MRN: [id number]  Chief Complaint  Patient presents with   Follow-up    HPI  Atrial fibrillation: Patient is a new patient appoint with me on 03/21/2023 patient was noted to be tachycardic on exam EKG was performed and showed atrial fibrillation with a rate in the 140s.  Patient's CHA2DS2-VASc score was 4 and patient was placed on Eliquis 5 mg twice daily he was already on metoprolol 25 mg daily.  Was increased to 50 mg daily patient is here for follow-up.  Of note patient was referred to cardiology 03/21/2023.  Patient was able to pick up the apixaban and start taking it without any trouble.  He also increase the dose of the metoprolol without any adverse drug event.  Patient denies any lightheadedness, dizziness, chest pain, shortness of breath, palpitations.    Review of Systems  Constitutional:  Negative for chills and fever.  Respiratory:  Negative for shortness of breath.   Cardiovascular:  Negative for chest pain and palpitations.  Neurological:  Negative for dizziness and headaches.      Objective:     BP 122/88   Pulse 97   Temp 97.8 F (36.6 C) (Oral)   Ht 5' 11.5" (1.816 m)   Wt 266 lb 12.8 oz (121 kg)   SpO2 100%   BMI 36.69 kg/m    Physical Exam Vitals and nursing note reviewed.  Constitutional:      Appearance: Normal appearance.  Cardiovascular:     Rate and Rhythm: Normal rate. Rhythm irregular.     Heart sounds: Normal heart sounds.  Pulmonary:     Effort: Pulmonary effort is normal.     Breath sounds: Normal breath sounds.  Neurological:     Mental Status: He is alert.      No results found for any visits on 03/25/23.    The ASCVD Risk score (Arnett DK, et al., 2019) failed to calculate for the following reasons:   The patient has a prior MI or stroke diagnosis    Assessment & Plan:   Problem List Items Addressed This Visit        Cardiovascular and Mediastinum   Primary hypertension    Patient currently maintained on metoprolol 50 mg daily along with losartan 100 mg daily.  Blood pressure within normal limits.  Patient is tolerating both medications well continue medications as prescribed      Relevant Medications   apixaban (ELIQUIS) 5 MG TABS tablet   losartan (COZAAR) 100 MG tablet   metoprolol succinate (TOPROL-XL) 50 MG 24 hr tablet   atorvastatin (LIPITOR) 40 MG tablet   Atrial fibrillation (HCC) - Primary    Patient currently maintained on apixaban 5 mg twice daily metoprolol 50 mg once daily XL.  Patient's rhythm is still irregular but rate ranged anywhere from mid 70s to higher 90s.  Patient is currently asymptomatic.  Did review signs and symptoms when to be reevaluated we will continue medications as is encourage patient to follow-up with cardiology once the referral has been completed and he has an appointment scheduled.      Relevant Medications   apixaban (ELIQUIS) 5 MG TABS tablet   losartan (COZAAR) 100 MG tablet   metoprolol succinate (TOPROL-XL) 50 MG 24 hr tablet   atorvastatin (LIPITOR) 40 MG tablet     Other   History of stroke   Relevant Medications  atorvastatin (LIPITOR) 40 MG tablet   Hyperlipidemia   Relevant Medications   apixaban (ELIQUIS) 5 MG TABS tablet   losartan (COZAAR) 100 MG tablet   metoprolol succinate (TOPROL-XL) 50 MG 24 hr tablet   atorvastatin (LIPITOR) 40 MG tablet   Environmental allergies   Relevant Medications   montelukast (SINGULAIR) 10 MG tablet    Return in about 3 months (around 06/25/2023) for BP recheck/ Afib.    Audria Nine, NP

## 2023-03-25 NOTE — Patient Instructions (Signed)
Nice to see you today I have refilled your medications Follow up with me in 3 months, sooner if you need me

## 2023-04-01 ENCOUNTER — Telehealth: Payer: Self-pay

## 2023-04-01 NOTE — Telephone Encounter (Signed)
Pt returning call in ref to procedure

## 2023-04-01 NOTE — Telephone Encounter (Signed)
Gastroenterology Pre-Procedure Review  Request Date: TBD Requesting Physician: Dr. Jodelle Gross  PATIENT REVIEW QUESTIONS: The patient responded to the following health history questions as indicated:    1. Are you having any GI issues? no 2. Do you have a personal history of Polyps?pt stated he believes he did have some polyps removed.  Colonoscopy was 01/11/2010 hyperplastic 3. Do you have a family history of Colon Cancer or Polyps? no 4. Diabetes Mellitus? no 5. Joint replacements in the past 12 months?no 6. Major health problems in the past 3 months?no 7. Any artificial heart valves, MVP, or defibrillator?no 8.  Cardiac conditions? Tachycardia will be seeing Dr. Mariah Milling 04/12/23    MEDICATIONS & ALLERGIES:    Patient reports the following regarding taking any anticoagulation/antiplatelet therapy:   Plavix, Coumadin, Eliquis, Xarelto, Lovenox, Pradaxa, Brilinta, or Effient? yes (takes plavix and Eliquis mananged by his pcp Mordecai Maes. Blood Thinner advice sent to pcp office.) Aspirin? no  Patient confirms/reports the following medications:  Current Outpatient Medications  Medication Sig Dispense Refill   apixaban (ELIQUIS) 5 MG TABS tablet Take 1 tablet (5 mg total) by mouth 2 (two) times daily. 60 tablet 1   Ascorbic Acid (VITAMIN C) 1000 MG tablet Take 1,000 mg by mouth daily.     atorvastatin (LIPITOR) 40 MG tablet Take 1 tablet (40 mg total) by mouth daily at 6 PM. 30 tablet 0   clopidogrel (PLAVIX) 75 MG tablet Take 1 tablet (75 mg total) by mouth daily. 90 tablet 3   fluticasone (FLONASE) 50 MCG/ACT nasal spray Place 2 sprays into both nostrils daily as needed for allergies.      L-Lysine 1000 MG TABS Take by mouth.     losartan (COZAAR) 100 MG tablet Take 1 tablet (100 mg total) by mouth daily. 90 tablet 3   metoprolol succinate (TOPROL-XL) 50 MG 24 hr tablet Take 1 tablet (50 mg total) by mouth daily. Take with or immediately following a meal. 90 tablet 3   montelukast (SINGULAIR)  10 MG tablet Take 1 tablet (10 mg total) by mouth daily. 90 tablet 3   Multiple Vitamins-Minerals (MENS 50+ MULTIVITAMIN PO) Take by mouth.     No current facility-administered medications for this visit.    Patient confirms/reports the following allergies:  No Known Allergies  No orders of the defined types were placed in this encounter.   AUTHORIZATION INFORMATION Primary Insurance: 1D#: Group #:  Secondary Insurance: 1D#: Group #:  SCHEDULE INFORMATION: Date: TBD Time: Location: [facility]

## 2023-04-10 ENCOUNTER — Telehealth: Payer: Self-pay | Admitting: *Deleted

## 2023-04-10 NOTE — Telephone Encounter (Signed)
 Lmovm to verify pt's card hx.

## 2023-04-12 ENCOUNTER — Encounter: Payer: Self-pay | Admitting: Cardiovascular Disease

## 2023-04-12 ENCOUNTER — Ambulatory Visit: Payer: Medicaid Other

## 2023-04-12 ENCOUNTER — Ambulatory Visit: Payer: Medicaid Other | Attending: Cardiovascular Disease | Admitting: Cardiovascular Disease

## 2023-04-12 VITALS — BP 126/78 | HR 78 | Ht 71.0 in | Wt 267.5 lb

## 2023-04-12 DIAGNOSIS — I4819 Other persistent atrial fibrillation: Secondary | ICD-10-CM

## 2023-04-12 DIAGNOSIS — Z8673 Personal history of transient ischemic attack (TIA), and cerebral infarction without residual deficits: Secondary | ICD-10-CM | POA: Diagnosis not present

## 2023-04-12 DIAGNOSIS — E782 Mixed hyperlipidemia: Secondary | ICD-10-CM

## 2023-04-12 DIAGNOSIS — I10 Essential (primary) hypertension: Secondary | ICD-10-CM

## 2023-04-12 DIAGNOSIS — I4891 Unspecified atrial fibrillation: Secondary | ICD-10-CM

## 2023-04-12 MED ORDER — LOSARTAN POTASSIUM 50 MG PO TABS: 50.0000 mg | ORAL_TABLET | Freq: Every day | ORAL | 3 refills | Status: DC

## 2023-04-12 MED ORDER — METOPROLOL SUCCINATE ER 50 MG PO TB24: 50.0000 mg | ORAL_TABLET | Freq: Two times a day (BID) | ORAL | 3 refills | Status: DC

## 2023-04-12 NOTE — Patient Instructions (Addendum)
Medication Instructions:  Please hold/stop the plavix  Please decrease the losartan down to 50 mg daily Please increase the metoprolol succinate up to 50 mg twice a day  If you need a refill on your cardiac medications before your next appointment, please call your pharmacy.   Lab work: No new labs needed  Testing/Procedures:   Your physician has requested that you have an echocardiogram. Echocardiography is a painless test that uses sound waves to create images of your heart. It provides your doctor with information about the size and shape of your heart and how well your heart's chambers and valves are working.   You may receive an ultrasound enhancing agent through an IV if needed to better visualize your heart during the echo. This procedure takes approximately one hour.  There are no restrictions for this procedure.  This will take place at [address] [facility] [address] [facility], [place] [address]   Please note: We ask at that you not bring children with you during ultrasound (echo/ vascular) testing. Due to room size and safety concerns, children are not allowed in the ultrasound rooms during exams. Our front office staff cannot provide observation of children in our lobby area while testing is being conducted. An adult accompanying a patient to their appointment will only be allowed in the ultrasound room at the discretion of the ultrasound technician under special circumstances. We apologize for any inconvenience.   Heart Monitor:  Your physician has requested you wear a ZIO monitor for 14  days.  Your monitor will be mailed to your home address within 3-5 business days. This is sent via [employer] from [employer]. However, if you have not received your monitor after 5 business days please send [place] a MyChart message or call the office at [phone], so we may follow up on this for you.   This monitor is a medical device (single patch monitor) that records the  heart's electrical activity. Doctors most often use these monitors to diagnose arrhythmias. Arrhythmias are problems with the speed or rhythm of the heartbeat.   [employer] supplies 1 patch per enrollment. Additional stickers are not available.  Please DO NOT apply the patch if you will be having a Nuclear Stress Test, Echocardiogram, Cardiac CT, Cardiac MRI, Chest X-ray during the period you would be wearing the monitor. The patch cannot be worn during these tests.  You cannot remove and re-apply the ZIO patch monitor.   Applying the Monitor: Once you receive your monitor, this will include a small razor, abrader, and 4 alcohol pads. Shave hair from upper left chest Rub abrader disc in 40 strokes over the left upper chest as indicated in your monitor instructions Clean area with 4 enclosed alcohol pads (there may be a mild & brief stinging sensation over the newly abraded area, but this is normal). Let dry Apply patch as indicated in monitor instructions. Patch will be placed under collarbone on the left side of the chest with arrow pointing upward. Rub adhesive wings for 2 minutes. Remove white label marked "1". Remove the white label marked "2". Rub patch adhesive wings for an 2 minutes.  While looking in a mirror, press and release button in the center of the patch. You may hear a "click". A small green light will flash 4-6 times and then stop. This will be your indicator that the monitor has been turned on.  Wearing the Monitor: Avoid showering during the first 24 hours of wearing the monitor.  After 24 hours you may shower with the patch on. Take brief showers with your back facing the shower head.  Avoid excessive sweating to help maximize wear time. Do not submerge the device, no hot tubs, and no swimming pools. Keep any lotions or oils away from the patch. Press the button if you feel a symptom. You will hear a small click. Record date, time, and symptoms in the Patient Logbook or  App.  Monitor Issues: Call [employer] Customer Care at [phone] if you have questions regarding your Zio Patch Monitor. Call them immediately if you see an orange/ amber colored light blinking on your monitor. If your monitor falls off and you cannot get this reapplied or if you need suggestions for securing your monitor call [employer] at [phone].   Returning the Monitor: Once you have completed wearing your monitor, follow instructions on the last 2 pages of the Patient Logbook. Stick monitor patch on to the last page of the Patient Logbook.  Place Patient Logbook with monitor in the return box provided. Use locking tab on box and tape box closed securely. The return box has pre-paid postage on it.  Place the return box in the regular [place] Mail box as soon as possible It will take anywhere from 1-2 weeks for your provider to receive and review your results once you mail this back. If for some reason you have misplaced your return box then call our office and we can provide another box and/or mail it off for you.   Billing  and Patient Assistance Program Information: We have supplied [employer] with any of your insurance information on file for billing purposes. [employer] offers a sliding scale Patient Assistance Program for patients that do not have insurance, or whose insurance does not completely cover the cost of the ZIO monitor. You must apply for the Patient Assistance Program to qualify for this discounted rate. To apply, please call [employer] at [phone], select option 1, ask to apply for the Patient Assistance Program. [employer] will ask your household income, and how many people are in your household. They will quote your out-of-pocket cost based on that information. [employer] will also be able to set up for a 53-month, interest-free payment plan if needed.    Follow-Up: At [facility], you and your health needs are our priority.  As part of our continuing mission to  provide you with exceptional heart care, we have created designated Provider Care Teams.  These Care Teams include your primary Cardiologist (physician) and Advanced Practice Providers (APPs -  Physician Assistants and Nurse Practitioners) who all work together to provide you with the care you need, when you need it.  You will need a follow up appointment in 6 months  Providers on your designated Care Team:   Nicolasa Ducking, NP Eula Listen, PA-C Cadence Fransico Michael, [place]  COVID-19 Vaccine Information can be found at: [email or web address] For questions related to vaccine distribution or appointments, please email [email or web address] or call [phone].

## 2023-04-12 NOTE — Progress Notes (Signed)
Cardiology Office Note  Date:  04/12/2023   ID:  Jeffery Small, DOB 11-04-1960, MRN [id number]  PCP:  Eden Emms, NP   Chief Complaint  Patient presents with   New Patient (Initial Visit)    AFIB no complaints today. Meds reviewed verbally with pt.    HPI:  [name] is a 62 year old gentleman with past medical history of History of stroke/TIA 2017, right arm deficit, Hyperlipidemia Hypertension Who presents by referral from Jeffery Small for consultation of his atrial fibrillation  Seen by primary care March 21, 2019 for EKG detailing atrial fibrillation rate 140 bpm Reports that he was asymptomatic from atrial fibrillation, did not appreciate tachycardia or palpitations, denies shortness of breath or chest discomfort Metoprolol was increased up to 50 daily Started on Eliquis 5 twice daily  Rhythm was still irregular on follow-up March 25, 2023  In follow-up today reports that he feels relatively well with no complaints No sleep apnea, no etoh abuse Denies leg edema, no PND orthopnea  Previously on aspirin Plavix for history of TIA 2017  EKG personally reviewed by myself on todays visit EKG Interpretation Date/Time:  Friday April 12 2023 10:13:18 EST Ventricular Rate:  78 PR Interval:  192 QRS Duration:  68 QT Interval:  378 QTC Calculation: 430 R Axis:   -10  Text Interpretation: Sinus rhythm with Premature atrial complexes Septal infarct , age undetermined Inferior infarct , age undetermined When compared with ECG of 26-Mar-2016 22:23, Premature atrial complexes are now Present Confirmed by Jeffery Small [phone]) on 04/12/2023 10:18:27 AM   Prior testing/imaging requested and reviewed Echo 2017 Left ventricle: The cavity size was normal. There was moderate   concentric hypertrophy. Systolic function was normal. The   estimated ejection fraction was in the range of 55% to 60%. Wall   motion was normal; there were no regional wall motion    abnormalities. Features are consistent with a pseudonormal left    ventricular filling pattern, with concomitant abnormal relaxation    and increased filling pressure (grade 2 diastolic dysfunction).  - Aortic valve: Trileaflet; mildly thickened leaflets.  - Aorta: Aortic root dimension: 43 mm (ED).  - Aortic root: The aortic root was moderately dilated.  - Left atrium: The atrium was mildly dilated.  - Inferior vena cava: The vessel was mildly dilated.   EKG Interpretation Date/Time:  Friday April 12 2023 10:13:18 EST Ventricular Rate:  78 PR Interval:  192 QRS Duration:  68 QT Interval:  378 QTC Calculation: 430 R Axis:   -10  Text Interpretation: Sinus rhythm with Premature atrial complexes Septal infarct , age undetermined Inferior infarct , age undetermined When compared with ECG of 26-Mar-2016 22:23, Premature atrial complexes are now Present Confirmed by Jeffery Small [phone]) on 04/12/2023 10:18:27 AM    PMH:   has a past medical history of TIA (transient ischemic attack) (2017), Hypercholesterolemia, and Hypertension.  PSH:    Past Surgical History:  Procedure Laterality Date   VASECTOMY     WISDOM TOOTH EXTRACTION      Current Outpatient Medications  Medication Sig Dispense Refill   apixaban (ELIQUIS) 5 MG TABS tablet Take 1 tablet (5 mg total) by mouth 2 (two) times daily. 60 tablet 1   Ascorbic Acid (VITAMIN C) 1000 MG tablet Take 1,000 mg by mouth daily.     atorvastatin (LIPITOR) 40 MG tablet Take 1 tablet (40 mg total) by mouth daily at 6 PM. 30 tablet 0   clopidogrel (PLAVIX) 75 MG  tablet Take 1 tablet (75 mg total) by mouth daily. 90 tablet 3   fluticasone (FLONASE) 50 MCG/ACT nasal spray Place 2 sprays into both nostrils daily as needed for allergies.      L-Lysine 1000 MG TABS Take by mouth daily in the afternoon.     losartan (COZAAR) 100 MG tablet Take 1 tablet (100 mg total) by mouth daily. 90 tablet 3   metoprolol succinate (TOPROL-XL) 50 MG 24 hr  tablet Take 1 tablet (50 mg total) by mouth daily. Take with or immediately following a meal. 90 tablet 3   montelukast (SINGULAIR) 10 MG tablet Take 1 tablet (10 mg total) by mouth daily. 90 tablet 3   Multiple Vitamins-Minerals (MENS 50+ MULTIVITAMIN PO) Take by mouth.     No current facility-administered medications for this visit.     Allergies:   Patient has no known allergies.   Social History:  The patient  reports that he quit smoking about 30 years ago. His smoking use included cigarettes. He started smoking about 16 years ago. He has never used smokeless tobacco. He reports that he does not drink alcohol and does not use drugs.   Family History:   family history includes Hypertension in his paternal grandfather and paternal grandmother; Suicidality in his father.    Review of Systems: Review of Systems  Constitutional: Negative.   HENT: Negative.    Respiratory: Negative.    Cardiovascular: Negative.   Gastrointestinal: Negative.   Musculoskeletal: Negative.   Neurological: Negative.   Psychiatric/Behavioral: Negative.    All other systems reviewed and are negative.    PHYSICAL EXAM: VS:  BP 126/78 (BP Location: Right Arm, Patient Position: Sitting, Cuff Size: Large)   Pulse 78   Ht 5\' 11"  (1.803 m)   Wt 267 lb 8 oz (121.3 kg)   SpO2 98%   BMI 37.31 kg/m  , BMI Body mass index is 37.31 kg/m. GEN: Well nourished, well developed, in no acute distress HEENT: normal Neck: no JVD, carotid bruits, or masses Cardiac: RRR; no murmurs, rubs, or gallops,no edema  Respiratory:  clear to auscultation bilaterally, normal work of breathing GI: soft, nontender, nondistended, + BS MS: no deformity or atrophy Skin: warm and dry, no rash Neuro:  Strength and sensation are intact Psych: euthymic mood, full affect   Recent Labs: 03/21/2023: ALT 29; BUN 17; Creatinine, Ser 1.01; Hemoglobin 16.4; Platelets 284.0; Potassium 4.6; Sodium 139; TSH 1.79    Lipid Panel Lab  Results  Component Value Date   CHOL 147 03/21/2023   HDL 39.60 03/21/2023   LDLCALC 51 03/21/2023   TRIG 283.0 (H) 03/21/2023      Wt Readings from Last 3 Encounters:  04/12/23 267 lb 8 oz (121.3 kg)  03/25/23 266 lb 12.8 oz (121 kg)  03/21/23 265 lb 6.4 oz (120.4 kg)       ASSESSMENT AND PLAN:  Problem List Items Addressed This Visit       Cardiology Problems   Primary hypertension   Relevant Orders   EKG 12-Lead (Completed)   Atrial fibrillation (HCC) - Primary   Relevant Orders   EKG 12-Lead (Completed)   Hyperlipidemia   Relevant Orders   EKG 12-Lead (Completed)     Other   History of stroke   Relevant Orders   EKG 12-Lead (Completed)   Distant atrial fibrillation Noted in November 2024, documented on EKG, appreciated 4 days later on follow-up with primary care In follow-up today back to normal sinus rhythm confirmed  on EKG -Recommended he continue Eliquis 5 twice daily given history of stroke, elevated CHA2DS2-VASc -Suggest to increase metoprolol succinate up to 50 twice daily, decrease losartan down to 50 daily -Zio monitor ordered to determine A-fib burden as he is asymptomatic -Echocardiogram ordered to rule out structural heart disease, previously with mildly dilated left atrium, moderate LVH  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 [facility] [place], [place] [phone]

## 2023-04-15 ENCOUNTER — Telehealth: Payer: Self-pay | Admitting: *Deleted

## 2023-04-15 DIAGNOSIS — I4819 Other persistent atrial fibrillation: Secondary | ICD-10-CM

## 2023-04-15 NOTE — Telephone Encounter (Signed)
   Pre-operative Risk Assessment    Patient Name: Jeffery Small  DOB: February 23, 1961 MRN: [id number]  DATE OF LAST VISIT: 04/12/23 DR. GOLLAN DATE OF NEXT VISIT: NONE    Request for Surgical Clearance    Procedure:   COLONOSCOPY  Date of Surgery:  Clearance TBD                                 Surgeon: NOT LISTED Surgeon's Group or Practice Name: [facility] Phone number:  [phone] ATTN: Iva Lento, CMA Fax number:  [phone]   Type of Clearance Requested:  ELIQUIS     Type of Anesthesia:  Not Indicated   Additional requests/questions:    Elpidio Anis   04/15/2023, 11:19 AM

## 2023-04-16 NOTE — Telephone Encounter (Signed)
Patient with diagnosis of A Fib on Eliquis for anticoagulation.    Procedure: colonoscopy Date of procedure: TBD   CHA2DS2-VASc Score = 3  This indicates a 3.2% annual risk of stroke. The patient's score is based upon: CHF History: 0 HTN History: 1 Diabetes History: 0 Stroke History: 2 Vascular Disease History: 0 Age Score: 0 Gender Score: 0   CrCl 99 mL/min Platelet count 284K  Per office protocol, patient can hold Eliquis for 1 day prior to procedure.     **This guidance is not considered finalized until pre-operative APP has relayed final recommendations.**

## 2023-04-16 NOTE — Telephone Encounter (Signed)
   Patient Name: Jeffery Small  DOB: 10-22-1960 MRN: [id number]  Primary Cardiologist: None  Clinical pharmacists have reviewed the patient's past medical history, labs, and current medications as part of preoperative protocol coverage. The following recommendations have been made:  Patient with diagnosis of A Fib on Eliquis for anticoagulation.     Procedure: colonoscopy Date of procedure: TBD     CHA2DS2-VASc Score = 3  This indicates a 3.2% annual risk of stroke. The patient's score is based upon: CHF History: 0 HTN History: 1 Diabetes History: 0 Stroke History: 2 Vascular Disease History: 0 Age Score: 0 Gender Score: 0     CrCl 99 mL/min Platelet count 284K   Per office protocol, patient can hold Eliquis for 1 day prior to procedure.  Please resume Eliquis as soon as possible postprocedure, at the discretion of the surgeon.     I will route this recommendation to the requesting party via Epic fax function and remove from pre-op pool.  Please call with questions.  Joylene Grapes, NP 04/16/2023, 12:27 PM

## 2023-04-22 ENCOUNTER — Telehealth: Payer: Self-pay

## 2023-04-22 NOTE — Telephone Encounter (Signed)
Blood Thinner Advice has been noted in patients chart note dated 04/14/24 "Per office protocol, patient can hold Eliquis for 1 day prior to procedure.  Please resume Eliquis as soon as possible postprocedure, at the discretion of the surgeon".   Patient has been contacted to call office to schedule.  Thanks.  Marcelino Duster, CMA

## 2023-04-29 ENCOUNTER — Other Ambulatory Visit: Payer: Self-pay

## 2023-04-29 DIAGNOSIS — Z1211 Encounter for screening for malignant neoplasm of colon: Secondary | ICD-10-CM

## 2023-04-29 MED ORDER — NA SULFATE-K SULFATE-MG SULF 17.5-3.13-1.6 GM/177ML PO SOLN: 1.0000 | Freq: Once | ORAL | 0 refills | Status: AC

## 2023-04-29 NOTE — Telephone Encounter (Signed)
Colonoscopy Triage completed on 04/01/23.  Blood thinner advice noted in Epic. Noted 04/15/23 Cardiology note. "Per office protocol, patient can hold Eliquis for 1 day prior to procedure.  Please resume Eliquis as soon as possible postprocedure, at the discretion of the surgeon".    Colonoscopy scheduled with Dr. Allegra Lai 05/23/23.  Thanks,  [place], [place]

## 2023-04-29 NOTE — Addendum Note (Signed)
Addended by: Avie Arenas on: 04/29/2023 09:45 AM   Modules accepted: Orders

## 2023-04-29 NOTE — Progress Notes (Signed)
Colonoscopy Triage completed on 04/01/23.  Blood thinner advice noted in Epic. Noted 04/15/23 Cardiology note. "Per office protocol, patient can hold Eliquis for 1 day prior to procedure.  Please resume Eliquis as soon as possible postprocedure, at the discretion of the surgeon".    Colonoscopy scheduled with Dr. Allegra Lai 05/23/23.  Thanks,  [place], [place]

## 2023-05-09 ENCOUNTER — Ambulatory Visit: Payer: Medicaid Other | Attending: Cardiovascular Disease

## 2023-05-09 DIAGNOSIS — I4819 Other persistent atrial fibrillation: Secondary | ICD-10-CM

## 2023-05-09 LAB — ECHOCARDIOGRAM COMPLETE
Area-P 1/2: 4.49 cm2
S' Lateral: 3.9 cm

## 2023-05-17 ENCOUNTER — Encounter: Payer: Self-pay | Admitting: Cardiovascular Disease

## 2023-05-20 DIAGNOSIS — I4891 Unspecified atrial fibrillation: Secondary | ICD-10-CM

## 2023-05-20 MED ORDER — METOPROLOL SUCCINATE ER 100 MG PO TB24: 100.0000 mg | ORAL_TABLET | Freq: Two times a day (BID) | ORAL | 3 refills | Status: DC

## 2023-05-23 ENCOUNTER — Encounter: Payer: Self-pay | Admitting: Gastroenterology

## 2023-05-23 ENCOUNTER — Ambulatory Visit
Admission: RE | Admit: 2023-05-23 | Discharge: 2023-05-23 | Disposition: A | Discharge status: Home / Self Care | Payer: Medicaid Other | Attending: Gastroenterology | Admitting: Gastroenterology

## 2023-05-23 ENCOUNTER — Encounter
Admission: RE | Disposition: A | Discharge status: Home / Self Care | Payer: Self-pay | Source: Home / Self Care | Attending: Gastroenterology

## 2023-05-23 ENCOUNTER — Ambulatory Visit: Payer: Medicaid Other | Admitting: Certified Registered Nurse Anesthetist

## 2023-05-23 ENCOUNTER — Other Ambulatory Visit: Payer: Self-pay

## 2023-05-23 DIAGNOSIS — Z87891 Personal history of nicotine dependence: Secondary | ICD-10-CM | POA: Diagnosis not present

## 2023-05-23 DIAGNOSIS — I10 Essential (primary) hypertension: Secondary | ICD-10-CM | POA: Diagnosis not present

## 2023-05-23 DIAGNOSIS — K5730 Diverticulosis of large intestine without perforation or abscess without bleeding: Secondary | ICD-10-CM | POA: Insufficient documentation

## 2023-05-23 DIAGNOSIS — Z1211 Encounter for screening for malignant neoplasm of colon: Secondary | ICD-10-CM | POA: Diagnosis not present

## 2023-05-23 DIAGNOSIS — Z01810 Encounter for preprocedural cardiovascular examination: Secondary | ICD-10-CM | POA: Diagnosis not present

## 2023-05-23 DIAGNOSIS — K635 Polyp of colon: Secondary | ICD-10-CM | POA: Diagnosis not present

## 2023-05-23 HISTORY — DX: Hyperlipidemia, unspecified: E78.5

## 2023-05-23 HISTORY — PX: POLYPECTOMY: SHX5525

## 2023-05-23 HISTORY — PX: COLONOSCOPY WITH PROPOFOL: SHX5780

## 2023-05-23 SURGERY — COLONOSCOPY WITH PROPOFOL
Anesthesia: General

## 2023-05-23 MED ORDER — SODIUM CHLORIDE 0.9 % IV SOLN: INTRAVENOUS | Status: DC

## 2023-05-23 MED ORDER — LIDOCAINE HCL (CARDIAC) PF 100 MG/5ML IV SOSY
PREFILLED_SYRINGE | INTRAVENOUS | Status: DC | PRN
  Administered 2023-05-23: 50 mg via INTRAVENOUS

## 2023-05-23 MED ORDER — PROPOFOL 500 MG/50ML IV EMUL
INTRAVENOUS | Status: DC | PRN
  Administered 2023-05-23: 140 ug/kg/min via INTRAVENOUS

## 2023-05-23 MED ORDER — DEXMEDETOMIDINE HCL IN NACL 80 MCG/20ML IV SOLN
INTRAVENOUS | Status: DC | PRN
  Administered 2023-05-23: 8 ug via INTRAVENOUS

## 2023-05-23 MED ORDER — PROPOFOL 10 MG/ML IV BOLUS
INTRAVENOUS | Status: DC | PRN
  Administered 2023-05-23: 80 mg via INTRAVENOUS

## 2023-05-23 NOTE — Transfer of Care (Signed)
Immediate Anesthesia Transfer of Care Note  Patient: Jeffery Small  Procedure(s) Performed: COLONOSCOPY WITH PROPOFOL POLYPECTOMY  Patient Location: Endoscopy Unit  Anesthesia Type:General  Level of Consciousness: awake, alert , and oriented  Airway & Oxygen Therapy: Patient Spontanous Breathing  Post-op Assessment: Report given to RN and Post -op Vital signs reviewed and stable  Post vital signs: Reviewed and stable  Last Vitals:  Vitals Value Taken Time  BP 96/62 05/23/23 0908  Temp    Pulse 137 05/23/23 0909  Resp 14   SpO2 96 % 05/23/23 0909  Vitals shown include unfiled device data.  Last Pain:  Vitals:   05/23/23 0802  TempSrc: Temporal  PainSc: 0-No pain         Complications: No notable events documented.

## 2023-05-23 NOTE — Progress Notes (Signed)
12 lead EKG performed. Dr. Karlton Lemon has reviewed, as well as rounded on patient.

## 2023-05-23 NOTE — Anesthesia Preprocedure Evaluation (Signed)
Anesthesia Evaluation  Patient identified by MRN, date of birth, ID band Patient awake    Reviewed: Allergy & Precautions, H&P , NPO status , Patient's Chart, lab work & pertinent test results, reviewed documented beta blocker date and time   History of Anesthesia Complications Negative for: history of anesthetic complications  Airway Mallampati: II  TM Distance: >3 FB Neck ROM: full    Dental  (+) Partial Upper, Partial Lower, Teeth Intact, Dental Advidsory Given   Pulmonary neg pulmonary ROS, former smoker   Pulmonary exam normal breath sounds clear to auscultation       Cardiovascular Exercise Tolerance: Good hypertension, (-) angina (-) Past MI and (-) Cardiac Stents Normal cardiovascular exam+ dysrhythmias (Sinus tach right now.  Questionable history of a fib) (-) Valvular Problems/Murmurs Rhythm:regular Rate:Normal     Neuro/Psych neg Seizures TIA negative psych ROS   GI/Hepatic negative GI ROS, Neg liver ROS,,,  Endo/Other  negative endocrine ROS    Renal/GU negative Renal ROS  negative genitourinary   Musculoskeletal   Abdominal   Peds  Hematology negative hematology ROS (+)   Anesthesia Other Findings Past Medical History: 2017: Hx-TIA (transient ischemic attack) No date: Hypercholesterolemia No date: Hyperlipidemia No date: Hypertension   Reproductive/Obstetrics negative OB ROS                             Anesthesia Physical Anesthesia Plan  ASA: 2  Anesthesia Plan: General   Post-op Pain Management:    Induction: Intravenous  PONV Risk Score and Plan: 2 and Propofol infusion, TIVA and Treatment may vary due to age or medical condition  Airway Management Planned: Natural Airway and Nasal Cannula  Additional Equipment:   Intra-op Plan:   Post-operative Plan:   Informed Consent: I have reviewed the patients History and Physical, chart, labs and discussed the  procedure including the risks, benefits and alternatives for the proposed anesthesia with the patient or authorized representative who has indicated his/her understanding and acceptance.     Dental Advisory Given  Plan Discussed with: Anesthesiologist, CRNA and Surgeon  Anesthesia Plan Comments:         Anesthesia Quick Evaluation

## 2023-05-23 NOTE — Op Note (Signed)
[facility] Patient Name: Jeffery Small Procedure Date: 05/23/2023 8:29 AM MRN: [id number] Account #: [id number] Date of Birth: 04-Dec-1960 Admit Type: Outpatient Age: 63 Room: [facility] ENDO ROOM 2 Gender: Male Note Status: Finalized Instrument Name: Colonoscope [id number] Procedure:             Colonoscopy Indications:           Screening for colorectal malignant neoplasm, Last                         colonoscopy: September 2011, Last colonoscopy 10 years                         ago Providers:             Toney Reil MD, MD Referring MD:          Toney Reil MD, MD (Referring MD), Genene Churn.                         Toney Reil (Referring MD) Medicines:             General Anesthesia Complications:         No immediate complications. Estimated blood loss: None. Procedure:             Pre-Anesthesia Assessment:                        - Prior to the procedure, a History and Physical was                         performed, and patient medications and allergies were                         reviewed. The patient is competent. The risks and                         benefits of the procedure and the sedation options and                         risks were discussed with the patient. All questions                         were answered and informed consent was obtained.                         Patient identification and proposed procedure were                         verified by the physician, the nurse, the                         anesthesiologist, the anesthetist and the technician                         in the pre-procedure area in the procedure room in the                         endoscopy suite. Mental Status Examination: alert and  oriented. Airway Examination: normal oropharyngeal                         airway and neck mobility. Respiratory Examination:                         clear to auscultation. CV Examination: normal.                          Prophylactic Antibiotics: The patient does not require                         prophylactic antibiotics. Prior Anticoagulants: The                         patient has taken Eliquis (apixaban), last dose was 3                         days prior to procedure. ASA Grade Assessment: III - A                         patient with severe systemic disease. After reviewing                         the risks and benefits, the patient was deemed in                         satisfactory condition to undergo the procedure. The                         anesthesia plan was to use general anesthesia.                         Immediately prior to administration of medications,                         the patient was re-assessed for adequacy to receive                         sedatives. The heart rate, respiratory rate, oxygen                         saturations, blood pressure, adequacy of pulmonary                         ventilation, and response to care were monitored                         throughout the procedure. The physical status of the                         patient was re-assessed after the procedure.                        After obtaining informed consent, the colonoscope was                         passed under direct vision. Throughout the procedure,  the patient's blood pressure, pulse, and oxygen                         saturations were monitored continuously. The                         Colonoscope was introduced through the anus and                         advanced to the the cecum, identified by appendiceal                         orifice and ileocecal valve. The colonoscopy was                         performed without difficulty. The patient tolerated                         the procedure well. The quality of the bowel                         preparation was evaluated using the BBPS [facility] Bowel                         Preparation Scale) with scores  of: Right Colon = 3,                         Transverse Colon = 3 and Left Colon = 3 (entire mucosa                         seen well with no residual staining, small fragments                         of stool or opaque liquid). The total BBPS score                         equals 9. The ileocecal valve, appendiceal orifice,                         and rectum were photographed. Findings:      The perianal and digital rectal examinations were normal. Pertinent       negatives include normal sphincter tone and no palpable rectal lesions.      A 4 mm polyp was found in the cecum. The polyp was sessile. The polyp       was removed with a cold snare. Resection and retrieval were complete.       Estimated blood loss: none.      Multiple large-mouthed diverticula were found in the recto-sigmoid colon       and sigmoid colon.      The retroflexed view of the distal rectum and anal verge was normal and       showed no anal or rectal abnormalities. Impression:            - One 4 mm polyp in the cecum, removed with a cold                         snare. Resected and retrieved.                        -  Diverticulosis in the recto-sigmoid colon and in the                         sigmoid colon.                        - The distal rectum and anal verge are normal on                         retroflexion view. Recommendation:        - Discharge patient to home (with escort).                        - Resume previous diet today.                        - Continue present medications.                        - Resume Eliquis (apixaban) today at prior dose. Refer                         to managing physician for further adjustment of                         therapy.                        - Await pathology results.                        - Repeat colonoscopy in 7-10 years for surveillance                         based on pathology results. Procedure Code(s):     --- Professional ---                         [phone], Colonoscopy, flexible; with removal of                         tumor(s), polyp(s), or other lesion(s) by snare                         technique Diagnosis Code(s):     --- Professional ---                        D12.0, Benign neoplasm of cecum                        Z12.11, Encounter for screening for malignant neoplasm                         of colon                        K57.30, Diverticulosis of large intestine without                         perforation or abscess without bleeding CPT copyright 2022 American [employer] Association. All rights reserved. The codes documented in this report are preliminary  and upon coder review may  be revised to meet current compliance requirements. Dr. Libby Maw Toney Reil MD, MD 05/23/2023 9:05:51 AM This report has been signed electronically. Number of Addenda: 0 Note Initiated On: 05/23/2023 8:29 AM Scope Withdrawal Time: 0 hours 7 minutes 42 seconds  Total Procedure Duration: 0 hours 9 minutes 26 seconds  Estimated Blood Loss:  Estimated blood loss: none.      [facility]

## 2023-05-23 NOTE — Anesthesia Postprocedure Evaluation (Signed)
Anesthesia Post Note  Patient: Jeffery Small  Procedure(s) Performed: COLONOSCOPY WITH PROPOFOL POLYPECTOMY  Patient location during evaluation: Endoscopy Anesthesia Type: General Level of consciousness: awake and alert Pain management: pain level controlled Vital Signs Assessment: post-procedure vital signs reviewed and stable Respiratory status: spontaneous breathing, nonlabored ventilation, respiratory function stable and patient connected to nasal cannula oxygen Cardiovascular status: blood pressure returned to baseline and stable Postop Assessment: no apparent nausea or vomiting Anesthetic complications: no   No notable events documented.   Last Vitals:  Vitals:   05/23/23 0918 05/23/23 0928  BP: 92/66 115/88  Pulse: (!) 141 (!) 141  Resp: 14 14  Temp:    SpO2: 97% 98%    Last Pain:  Vitals:   05/23/23 0928  TempSrc:   PainSc: 0-No pain                 Lenard Simmer

## 2023-05-23 NOTE — H&P (Signed)
Arlyss Repress, MD [address]  [address]  [place], [place] [address]  Main: [phone]  Fax: [phone] Pager: [phone]  Primary Care Physician:  Eden Emms, NP Primary Gastroenterologist:  Dr. Arlyss Repress  Pre-Procedure History & Physical: HPI:  Jaziyah Fauble is a 63 y.o. male is here for an colonoscopy.   Past Medical History:  Diagnosis Date   Hx-TIA (transient ischemic attack) 2017   Hypercholesterolemia    Hypertension     Past Surgical History:  Procedure Laterality Date   COLONOSCOPY     VASECTOMY     WISDOM TOOTH EXTRACTION      Prior to Admission medications   Medication Sig Start Date End Date Taking? Authorizing Provider  apixaban (ELIQUIS) 5 MG TABS tablet Take 1 tablet (5 mg total) by mouth 2 (two) times daily. 03/25/23   Eden Emms, NP  Ascorbic Acid (VITAMIN C) 1000 MG tablet Take 1,000 mg by mouth daily.   Yes [provider]  atorvastatin (LIPITOR) 40 MG tablet Take 1 tablet (40 mg total) by mouth daily at 6 PM. 03/25/23  Yes Eden Emms, NP  fluticasone (FLONASE) 50 MCG/ACT nasal spray Place 2 sprays into both nostrils daily as needed for allergies.  01/21/16   [provider]  L-Lysine 1000 MG TABS Take by mouth daily in the afternoon.   Yes [provider]  metoprolol succinate (TOPROL-XL) 100 MG 24 hr tablet Take 1 tablet (100 mg total) by mouth 2 (two) times daily. Take with or immediately following a meal. 05/20/23  Yes Gollan, Tollie Pizza, MD  montelukast (SINGULAIR) 10 MG tablet Take 1 tablet (10 mg total) by mouth daily. 03/25/23  Yes Eden Emms, NP  Multiple Vitamins-Minerals (MENS 50+ MULTIVITAMIN PO) Take by mouth.   Yes [provider]    Allergies as of 04/29/2023   (No Known Allergies)    Family History  Problem Relation Age of Onset   Suicidality Father    Hypertension Paternal Grandmother    Hypertension Paternal Grandfather     Social History   Socioeconomic History    Marital status: Divorced    Spouse name: Not on file   Number of children: 2   Years of education: Not on file   Highest education level: Not on file  Occupational History   Not on file  Tobacco Use   Smoking status: Former    Types: Cigarettes    Start date: 2008    Quit date: 03/20/1993    Years since quitting: 30.1   Smokeless tobacco: Never  Vaping Use   Vaping status: Never Used  Substance and Sexual Activity   Alcohol use: No    Comment: Former EtOH use, quit four years ago.   Drug use: No   Sexual activity: Not on file  Other Topics Concern   Not on file  Social History Narrative   Retired Loss adjuster, chartered at [employer] (37)   Revonda Standard ( 36)   Social Drivers of Health   Financial Resource Strain: Not on file  Food Insecurity: Not on file  Transportation Needs: Not on file  Physical Activity: Not on file  Stress: Not on file  Social Connections: Not on file  Intimate Partner Violence: Not on file    Review of Systems: See HPI, otherwise negative ROS  Physical Exam: BP (!) 133/113   Pulse (!) 146   Temp 97.6 F (36.4 C) (Temporal)  Resp 18   Ht 6' (1.829 m)   Wt 120.2 kg   SpO2 100%   BMI 35.94 kg/m  General:   Alert,  pleasant and cooperative in NAD Head:  Normocephalic and atraumatic. Neck:  Supple; no masses or thyromegaly. Lungs:  Clear throughout to auscultation.    Heart:  Regular rate and rhythm. Abdomen:  Soft, nontender and nondistended. Normal bowel sounds, without guarding, and without rebound.   Neurologic:  Alert and  oriented x4;  grossly normal neurologically.  Impression/Plan: Jaysten Rockman is here for an colonoscopy to be performed for colon cancer screening  Risks, benefits, limitations, and alternatives regarding  colonoscopy have been reviewed with the patient.  Questions have been answered.  All parties agreeable.   Lannette Donath, MD  05/23/2023, 8:26 AM

## 2023-05-27 LAB — SURGICAL PATHOLOGY

## 2023-05-28 ENCOUNTER — Encounter: Payer: Self-pay | Admitting: Gastroenterology

## 2023-06-25 ENCOUNTER — Ambulatory Visit: Payer: Medicaid Other | Admitting: Nurse Practitioner

## 2023-06-27 ENCOUNTER — Ambulatory Visit: Payer: Medicaid Other | Admitting: Nurse Practitioner

## 2023-06-27 VITALS — BP 126/88 | HR 64 | Temp 98.1°F | Ht 72.0 in | Wt 272.2 lb

## 2023-06-27 DIAGNOSIS — I4891 Unspecified atrial fibrillation: Secondary | ICD-10-CM | POA: Diagnosis not present

## 2023-06-27 DIAGNOSIS — E669 Obesity, unspecified: Secondary | ICD-10-CM | POA: Diagnosis not present

## 2023-06-27 DIAGNOSIS — I10 Essential (primary) hypertension: Secondary | ICD-10-CM

## 2023-06-27 MED ORDER — WEGOVY 0.25 MG/0.5ML ~~LOC~~ SOAJ: 0.2500 mg | SUBCUTANEOUS | 0 refills | Status: DC

## 2023-06-27 MED ORDER — APIXABAN 5 MG PO TABS: 5.0000 mg | ORAL_TABLET | Freq: Two times a day (BID) | ORAL | 3 refills | Status: AC

## 2023-06-27 NOTE — Assessment & Plan Note (Signed)
 Continue working healthy lifestyle modification we will start Wegovy 0.25 mg once a week with plans of titrating up monthly as patient tolerates medication.  Patient has no personal or family history of medullary thyroid cancer or multiple endocrine neoplasia syndrome type II

## 2023-06-27 NOTE — Assessment & Plan Note (Signed)
 Patient currently maintained on apixaban 5 mg twice daily and metoprolol 100 mg twice daily.  He is followed by cardiology.  Continue taking medication as prescribed follow with cardiology as recommended

## 2023-06-27 NOTE — Patient Instructions (Signed)
 Nice to see you today I have sent in the once a week injectable, wegovy Follow up with me in 3 months, sooner if you need me

## 2023-06-27 NOTE — Assessment & Plan Note (Signed)
 Patient currently maintained on metoprolol 100 mg twice daily.  Blood pressure well controlled.  Continue medication as prescribed

## 2023-06-27 NOTE — Progress Notes (Signed)
 Established Patient Office Visit  Subjective   Patient ID: Jeffery Small, male    DOB: 10/11/1960  Age: 63 y.o. MRN: [id number]  Chief Complaint  Patient presents with   Follow-up    Pt states that he is doing good. Pt requests change of Eliquis to 90 day prescriptions.     HPI  Afib: Patient had a new patient appointment with me on 03/21/2023 during exam he was noticed to be tachycardic EKG performed and was shown to be in atrial fibrillation.  Patient was already on a beta-blocker.  We did initiate anticoagulation increase patient's beta-blocker.  Patient was referred to cardiology.  He recommended continuation of Eliquis 5 mg twice daily increase metoprolol 50 mg from daily to twice daily.  They also decreased patient's losartan to 50 mg daily.  Cardiology also recommended an echocardiogram along with a heart monitor given patient is a symptomatic presentation of atrial fibrillation.  Obesity: he is eating 2 normal meals and sometimes 3 times a day. He will snack at night time. Coffee in the am and water through out the day. No sodas He does try to go to the gym 3 days a week at 30 mins a time   Review of Systems  Constitutional:  Negative for chills and fever.  Respiratory:  Negative for shortness of breath.   Cardiovascular:  Negative for chest pain.  Gastrointestinal:  Negative for abdominal pain, diarrhea, nausea and vomiting.  Neurological:  Negative for dizziness and headaches.      Objective:     BP 126/88   Pulse 64   Temp 98.1 F (36.7 C) (Oral)   Ht 6' (1.829 m)   Wt 272 lb 3.2 oz (123.5 kg)   SpO2 98%   BMI 36.92 kg/m  BP Readings from Last 3 Encounters:  06/27/23 126/88  05/23/23 115/88  04/12/23 126/78   Wt Readings from Last 3 Encounters:  06/27/23 272 lb 3.2 oz (123.5 kg)  05/23/23 265 lb (120.2 kg)  04/12/23 267 lb 8 oz (121.3 kg)   SpO2 Readings from Last 3 Encounters:  06/27/23 98%  05/23/23 98%  04/12/23 98%      Physical Exam Vitals  and nursing note reviewed.  Constitutional:      Appearance: Normal appearance.  Cardiovascular:     Rate and Rhythm: Normal rate and regular rhythm.     Heart sounds: Normal heart sounds.  Pulmonary:     Effort: Pulmonary effort is normal.     Breath sounds: Normal breath sounds.  Abdominal:     General: Bowel sounds are normal.  Neurological:     Mental Status: He is alert.      No results found for any visits on 06/27/23.    The ASCVD Risk score (Arnett DK, et al., 2019) failed to calculate for the following reasons:   Risk score cannot be calculated because patient has a medical history suggesting prior/existing ASCVD    Assessment & Plan:   Problem List Items Addressed This Visit       Cardiovascular and Mediastinum   Primary hypertension   Patient currently maintained on metoprolol 100 mg twice daily.  Blood pressure well controlled.  Continue medication as prescribed      Relevant Medications   apixaban (ELIQUIS) 5 MG TABS tablet   Atrial fibrillation (HCC)   Patient currently maintained on apixaban 5 mg twice daily and metoprolol 100 mg twice daily.  He is followed by cardiology.  Continue taking medication as prescribed  follow with cardiology as recommended      Relevant Medications   apixaban (ELIQUIS) 5 MG TABS tablet     Other   Obesity (BMI 30-39.9) - Primary   Continue working healthy lifestyle modification we will start Wegovy 0.25 mg once a week with plans of titrating up monthly as patient tolerates medication.  Patient has no personal or family history of medullary thyroid cancer or multiple endocrine neoplasia syndrome type II      Relevant Medications   Semaglutide-Weight Management (WEGOVY) 0.25 MG/0.5ML SOAJ    Return in about 3 months (around 09/24/2023) for weight/wegovy.    Audria Nine, NP

## 2023-06-28 ENCOUNTER — Other Ambulatory Visit (HOSPITAL_COMMUNITY): Payer: Self-pay

## 2023-06-28 ENCOUNTER — Telehealth: Payer: Self-pay | Admitting: Pharmacy Technician

## 2023-06-28 NOTE — Telephone Encounter (Signed)
 Pharmacy Patient Advocate Encounter   Received notification from Onbase that prior authorization for [facility] 0.25MG /0.5ML PEN is required/requested.   Insurance verification completed.   The patient is insured through [facility] .   Per test claim: PA required; PA submitted to above mentioned insurance via CoverMyMeds Key/confirmation #/EOC [id number] Status is pending

## 2023-06-28 NOTE — Telephone Encounter (Signed)
 Pharmacy Patient Advocate Encounter  Received notification from [facility] that Prior Authorization for [facility] 0.25MG /0.5ML PEN has been APPROVED from 06/28/2023 to 06/28/2023. Ran test claim, Copay is $4.00. This test claim was processed through [facility]- copay amounts may vary at other pharmacies due to pharmacy/plan contracts, or as the patient moves through the different stages of their insurance plan.   PA #/Case ID/Reference #: Key: [id number]

## 2023-07-12 ENCOUNTER — Other Ambulatory Visit: Payer: Self-pay | Admitting: Nurse Practitioner

## 2023-07-12 DIAGNOSIS — Z8673 Personal history of transient ischemic attack (TIA), and cerebral infarction without residual deficits: Secondary | ICD-10-CM

## 2023-07-12 DIAGNOSIS — E785 Hyperlipidemia, unspecified: Secondary | ICD-10-CM

## 2023-07-12 NOTE — Telephone Encounter (Signed)
 Copied from CRM [id number]. Topic: Clinical - Medication Refill >> Jul 12, 2023 11:32 AM Orinda Kenner C wrote: Most Recent Primary Care Visit:  Provider: Eden Emms  Department: [facility]  Visit Type: OFFICE VISIT  Date: 06/27/2023  Medication: atorvastatin (LIPITOR) 40 MG tablet for 90 days supply   Has the patient contacted their pharmacy? Yes (Agent: If no, request that the patient contact the pharmacy for the refill. If patient does not wish to contact the pharmacy document the reason why and proceed with request.) (Agent: If yes, when and what did the pharmacy advise?)  Is this the correct pharmacy for this prescription? Yes If no, delete pharmacy and type the correct one.  This is the patient's preferred pharmacy:  [facility] [phone] [facility] [place] - [address] [place] [address] Phone: [phone] Fax: [phone]   Has the prescription been filled recently? No  Is the patient out of the medication? Yes will be out this weekend  Has the patient been seen for an appointment in the last year OR does the patient have an upcoming appointment? Yes, 06/27/23 and  09/25/23  Can we respond through MyChart? No, please call back [phone]  Agent: Please be advised that Rx refills may take up to 3 business days. We ask that you follow-up with your pharmacy.

## 2023-07-12 NOTE — Telephone Encounter (Signed)
 Last Fill: 03/25/23  Last OV: 06/27/23 Next OV: 09/25/23  Routing to provider for review/authorization.

## 2023-07-16 MED ORDER — ATORVASTATIN CALCIUM 40 MG PO TABS: 40.0000 mg | ORAL_TABLET | Freq: Every day | ORAL | 1 refills | Status: DC

## 2023-07-22 ENCOUNTER — Other Ambulatory Visit: Payer: Self-pay | Admitting: Nurse Practitioner

## 2023-07-22 DIAGNOSIS — E669 Obesity, unspecified: Secondary | ICD-10-CM

## 2023-08-17 ENCOUNTER — Other Ambulatory Visit: Payer: Self-pay | Admitting: Nurse Practitioner

## 2023-08-17 DIAGNOSIS — E669 Obesity, unspecified: Secondary | ICD-10-CM

## 2023-09-12 ENCOUNTER — Other Ambulatory Visit: Payer: Self-pay | Admitting: Nurse Practitioner

## 2023-09-12 DIAGNOSIS — E669 Obesity, unspecified: Secondary | ICD-10-CM

## 2023-09-25 ENCOUNTER — Ambulatory Visit (INDEPENDENT_AMBULATORY_CARE_PROVIDER_SITE_OTHER): Payer: Medicaid Other | Admitting: Nurse Practitioner

## 2023-09-25 VITALS — BP 118/78 | HR 76 | Temp 97.5°F | Ht 72.0 in | Wt 252.8 lb

## 2023-09-25 DIAGNOSIS — Z8673 Personal history of transient ischemic attack (TIA), and cerebral infarction without residual deficits: Secondary | ICD-10-CM | POA: Diagnosis not present

## 2023-09-25 DIAGNOSIS — Q649 Congenital malformation of urinary system, unspecified: Secondary | ICD-10-CM | POA: Insufficient documentation

## 2023-09-25 DIAGNOSIS — E669 Obesity, unspecified: Secondary | ICD-10-CM | POA: Diagnosis not present

## 2023-09-25 LAB — URINALYSIS, ROUTINE W REFLEX MICROSCOPIC
Bilirubin Urine: NEGATIVE
Hgb urine dipstick: NEGATIVE
Ketones, ur: NEGATIVE
Leukocytes,Ua: NEGATIVE
Nitrite: NEGATIVE
Specific Gravity, Urine: 1.025 (ref 1.000–1.030)
Total Protein, Urine: NEGATIVE
Urine Glucose: NEGATIVE
Urobilinogen, UA: 0.2 (ref 0.0–1.0)
pH: 6 (ref 5.0–8.0)

## 2023-09-25 NOTE — Assessment & Plan Note (Signed)
 Patient is 1 week into the Wegovy  1.7 mg dosing.  Tolerating medications well.  He is working on his lifestyle modifications had appreciable weight loss of 20 pounds.  Will continue Wegovy  at 1.7 mg weekly anticipate to titrate up to the 2.4 mg once he finishes a months worth of current dosing.  Continue following up every 3 months while on medication

## 2023-09-25 NOTE — Progress Notes (Signed)
 Established Patient Office Visit  Subjective   Patient ID: Jeffery Small, male    DOB: 09/30/1960  Age: 63 y.o. MRN: [id number]  Chief Complaint  Patient presents with   Weight Check   Medication Management    Wegovy . Pt complains he's doing okay on medication. States he has lost about 20 pounds.     HPI  Obesity: Patient was seen by me on 06/20/2023.  He was working on lifestyle modifications in regards to his obesity we did start patient Wegovy  0.25 mg weekly with plan to titrate up monthly as he tolerates the medication.  Patient is currently on Wegovy  1.7 mg weekly and is here for follow-up of note patient does have comorbid conditions of atrial fibrillation and hypertension with a history of CVA.  States that he has had some brown urine and beet red and then clears. States that it is once 2-3 weeks. No pain  He is eating 2.5 meals a day and having little snacks and doing water and coffee. He has cut back on his portion  He does 1-1.5 miles 3 days a week     Review of Systems  Constitutional:  Negative for chills and fever.  Respiratory:  Negative for shortness of breath.   Cardiovascular:  Negative for chest pain.  Gastrointestinal:  Negative for abdominal pain, constipation, diarrhea, nausea and vomiting.       BM daily   Genitourinary:  Positive for hematuria. Negative for dysuria, frequency and urgency.      Objective:     BP 118/78   Pulse 76   Temp (!) 97.5 F (36.4 C) (Oral)   Ht 6' (1.829 m)   Wt 252 lb 12.8 oz (114.7 kg)   SpO2 98%   BMI 34.29 kg/m  BP Readings from Last 3 Encounters:  09/25/23 118/78  06/27/23 126/88  05/23/23 115/88   Wt Readings from Last 3 Encounters:  09/25/23 252 lb 12.8 oz (114.7 kg)  06/27/23 272 lb 3.2 oz (123.5 kg)  05/23/23 265 lb (120.2 kg)   SpO2 Readings from Last 3 Encounters:  09/25/23 98%  06/27/23 98%  05/23/23 98%      Physical Exam Vitals and nursing note reviewed.  Constitutional:      Appearance:  Normal appearance.  Cardiovascular:     Rate and Rhythm: Normal rate. Rhythm irregular.     Heart sounds: Normal heart sounds.  Pulmonary:     Effort: Pulmonary effort is normal.     Breath sounds: Normal breath sounds.  Neurological:     Mental Status: He is alert.      No results found for any visits on 09/25/23.    The ASCVD Risk score (Arnett DK, et al., 2019) failed to calculate for the following reasons:   Risk score cannot be calculated because patient has a medical history suggesting prior/existing ASCVD    Assessment & Plan:   Problem List Items Addressed This Visit       Genitourinary   Urinary anomaly - Primary   Patient states he had brown/red-colored urine that is intermittent.  Given patient's history we will check a UA with reflex to microscopic examination today.  If consistent consider may be referring to urology for possible intermittent hematuria.  Patient states he lifted up and said it was a side effect of the GLP-1 receptor agonist.      Relevant Orders   Urinalysis, Routine w reflex microscopic     Other   History of stroke  Obesity (BMI 30-39.9)   Patient is 1 week into the Wegovy  1.7 mg dosing.  Tolerating medications well.  He is working on his lifestyle modifications had appreciable weight loss of 20 pounds.  Will continue Wegovy  at 1.7 mg weekly anticipate to titrate up to the 2.4 mg once he finishes a months worth of current dosing.  Continue following up every 3 months while on medication       Return in about 3 months (around 12/26/2023) for weight/wegovy  recheck .    Margarie Shay, NP

## 2023-09-25 NOTE — Patient Instructions (Signed)
 Nice to see you today I will be in touch with the urine results once I have them Follow up with me in 3 months, sooner if you need me

## 2023-09-25 NOTE — Assessment & Plan Note (Signed)
 Patient states he had brown/red-colored urine that is intermittent.  Given patient's history we will check a UA with reflex to microscopic examination today.  If consistent consider may be referring to urology for possible intermittent hematuria.  Patient states he lifted up and said it was a side effect of the GLP-1 receptor agonist.

## 2023-09-27 ENCOUNTER — Ambulatory Visit: Payer: Self-pay | Admitting: Nurse Practitioner

## 2023-10-08 ENCOUNTER — Other Ambulatory Visit: Payer: Self-pay | Admitting: Nurse Practitioner

## 2023-10-08 DIAGNOSIS — E669 Obesity, unspecified: Secondary | ICD-10-CM

## 2023-10-20 NOTE — Progress Notes (Unsigned)
 Cardiology Office Note  Date:  10/21/2023   ID:  Jeffery Small, DOB [birth year], MRN [id number]  PCP:  Jeffery Lynwood HERO, NP   Chief Complaint  Patient presents with   Follow-up    6 month f/u. No complain pt said feel preety good.    HPI:  [name] is a 63 year old gentleman with past medical history of History of stroke/TIA 2017, right arm deficit, Hyperlipidemia Hypertension Who presents for follow-up of his atrial fibrillation  Last seen in clinic 12/24 Atrial fibrillation noted November 2024 on EKG On visit with us  April 12, 2023 back in normal sinus rhythm Metoprolol  succinate 50 twice daily, Eliquis  5 twice daily  Presented for colonoscopy May 23, 2023, was noted to be in atrial fibrillation with RVR rate 146 bpm Underwent colonoscopy No complications with procedure  He does not track heart rate or blood pressure at home Reports he is active, mows several days a week,  Goes to the gym able to walk 1 mile or more without any shortness of breath or chest discomfort  Denies leg swelling, no PND orthopnea  Echocardiogram January 2025  normal left and right ventricular function,  severely dilated left atrium, mild to moderate MR, Mild dilation ascending aorta 4 cm  Prior event monitor showing A-fib burden 59% Metoprolol  increased up to 50 twice daily  EKG personally reviewed by myself on todays visit EKG Interpretation Date/Time:  Monday October 21 2023 12:00:59 EDT Ventricular Rate:  104 PR Interval:    QRS Duration:  70 QT Interval:  348 QTC Calculation: 457 R Axis:   -10  Text Interpretation: Atrial fibrillation with rapid ventricular response When compared with ECG of 23-May-2023 08:22, Atrial fibrillation has replaced Sinus rhythm Non-specific change in ST segment in Inferior leads Confirmed by Perla Lye [phone]) on 10/21/2023 12:46:45 PM    Previously on aspirin  Plavix  for history of TIA 2017  Echo 2017 Left ventricle: The cavity size was  normal. There was moderate   concentric hypertrophy. Systolic function was normal. The   estimated ejection fraction was in the range of 55% to 60%. Wall   motion was normal; there were no regional wall motion   abnormalities. Features are consistent with a pseudonormal left    ventricular filling pattern, with concomitant abnormal relaxation    and increased filling pressure (grade 2 diastolic dysfunction).  - Aortic valve: Trileaflet; mildly thickened leaflets.  - Aorta: Aortic root dimension: 43 mm (ED).  - Aortic root: The aortic root was moderately dilated.  - Left atrium: The atrium was mildly dilated.  - Inferior vena cava: The vessel was mildly dilated.        PMH:   has a past medical history of TIA (transient ischemic attack) (2017), Hypercholesterolemia, Hyperlipidemia, and Hypertension.  PSH:    Past Surgical History:  Procedure Laterality Date   COLONOSCOPY     COLONOSCOPY WITH PROPOFOL  N/A 05/23/2023   Procedure: COLONOSCOPY WITH PROPOFOL ;  Surgeon: Unk Corinn Skiff, MD;  Location: [facility] ENDOSCOPY;  Service: Gastroenterology;  Laterality: N/A;   POLYPECTOMY  05/23/2023   Procedure: POLYPECTOMY;  Surgeon: Unk Corinn Skiff, MD;  Location: [facility] ENDOSCOPY;  Service: Gastroenterology;;   VASECTOMY     WISDOM TOOTH EXTRACTION      Current Outpatient Medications  Medication Sig Dispense Refill   apixaban  (ELIQUIS ) 5 MG TABS tablet Take 1 tablet (5 mg total) by mouth 2 (two) times daily. 180 tablet 3   Ascorbic Acid (VITAMIN C) 1000 MG tablet Take 1,000  mg by mouth daily.     atorvastatin  (LIPITOR) 40 MG tablet Take 1 tablet (40 mg total) by mouth daily at 6 PM. 90 tablet 1   fluticasone (FLONASE) 50 MCG/ACT nasal spray Place 2 sprays into both nostrils daily as needed for allergies.      L-Lysine 1000 MG TABS Take by mouth daily in the afternoon.     metoprolol  succinate (TOPROL -XL) 100 MG 24 hr tablet Take 1 tablet (100 mg total) by mouth 2 (two) times daily. Take with or  immediately following a meal. 180 tablet 3   montelukast  (SINGULAIR ) 10 MG tablet Take 1 tablet (10 mg total) by mouth daily. 90 tablet 3   Multiple Vitamins-Minerals (MENS 50+ MULTIVITAMIN PO) Take by mouth.     Semaglutide -Weight Management (WEGOVY ) 2.4 MG/0.75ML SOAJ Inject 2.4 mg into the skin once a week. 3 mL 0   No current facility-administered medications for this visit.    Allergies:   Patient has no known allergies.   Social History:  The patient  reports that he quit smoking about 30 years ago. His smoking use included cigarettes. He started smoking about 17 years ago. He has never used smokeless tobacco. He reports that he does not drink alcohol and does not use drugs.   Family History:   family history includes Hypertension in his paternal grandfather and paternal grandmother; Suicidality in his father.    Review of Systems: Review of Systems  Constitutional: Negative.   HENT: Negative.    Respiratory: Negative.    Cardiovascular: Negative.   Gastrointestinal: Negative.   Musculoskeletal: Negative.   Neurological: Negative.   Psychiatric/Behavioral: Negative.    All other systems reviewed and are negative.    PHYSICAL EXAM: VS:  BP 100/60 (BP Location: Left Arm, Patient Position: Sitting, Cuff Size: Normal)   Pulse (!) 104   Ht 5' 11 (1.803 m)   Wt 246 lb 3.2 oz (111.7 kg)   SpO2 99%   BMI 34.34 kg/m  , BMI Body mass index is 34.34 kg/m. GEN: Well nourished, well developed, in no acute distress HEENT: normal Neck: no JVD, carotid bruits, or masses Cardiac: Irregularly irregular; no murmurs, rubs, or gallops,no edema  Respiratory:  clear to auscultation bilaterally, normal work of breathing GI: soft, nontender, nondistended, + BS MS: no deformity or atrophy Skin: warm and dry, no rash Neuro:  Strength and sensation are intact Psych: euthymic mood, full affect  Recent Labs: 03/21/2023: ALT 29; BUN 17; Creatinine, Ser 1.01; Hemoglobin 16.4; Platelets  284.0; Potassium 4.6; Sodium 139; TSH 1.79    Lipid Panel Lab Results  Component Value Date   CHOL 147 03/21/2023   HDL 39.60 03/21/2023   LDLCALC 51 03/21/2023   TRIG 283.0 (H) 03/21/2023      Wt Readings from Last 3 Encounters:  10/21/23 246 lb 3.2 oz (111.7 kg)  09/25/23 252 lb 12.8 oz (114.7 kg)  06/27/23 272 lb 3.2 oz (123.5 kg)     ASSESSMENT AND PLAN:  Problem List Items Addressed This Visit       Cardiology Problems   Primary hypertension   Relevant Orders   EKG 12-Lead (Completed)   Atrial fibrillation (HCC) - Primary   Relevant Orders   EKG 12-Lead (Completed)   Hyperlipidemia     Other   Obesity (BMI 30-39.9)   History of stroke    Persistent atrial fibrillation Noted in November 2024, normal sinus rhythm December 2024  Event monitor with high A-fib burden, asymptomatic January 2025 back  in atrial fibrillation with RVR for colonoscopy Remains in atrial fibrillation today asymptomatic -Discussed various treatment options with him including initiation of antiarrhythmic medication and consideration of cardioversion if he remains in atrial fibrillation versus rate control and medical management -As he is asymptomatic, he prefers rate control and medical management -This is likely the best option given severely dilated left atrium which will predispose him to atrial fibrillation -Recommend he closely monitor heart rate at home with pulse oximeter and call our office with numbers particularly if running over 110 bpm at rest -Continue Eliquis  5 twice daily  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 [facility] [place],  [place] [phone]

## 2023-10-21 ENCOUNTER — Ambulatory Visit: Payer: Medicaid Other | Attending: Cardiovascular Disease | Admitting: Cardiovascular Disease

## 2023-10-21 ENCOUNTER — Encounter: Payer: Self-pay | Admitting: Cardiovascular Disease

## 2023-10-21 VITALS — BP 100/60 | HR 104 | Ht 71.0 in | Wt 246.2 lb

## 2023-10-21 DIAGNOSIS — Z8673 Personal history of transient ischemic attack (TIA), and cerebral infarction without residual deficits: Secondary | ICD-10-CM | POA: Diagnosis not present

## 2023-10-21 DIAGNOSIS — I10 Essential (primary) hypertension: Secondary | ICD-10-CM | POA: Diagnosis not present

## 2023-10-21 DIAGNOSIS — E782 Mixed hyperlipidemia: Secondary | ICD-10-CM | POA: Diagnosis not present

## 2023-10-21 DIAGNOSIS — I4891 Unspecified atrial fibrillation: Secondary | ICD-10-CM | POA: Diagnosis not present

## 2023-10-21 DIAGNOSIS — E669 Obesity, unspecified: Secondary | ICD-10-CM | POA: Insufficient documentation

## 2023-10-21 NOTE — Patient Instructions (Addendum)
 Monitor heart rate at home Uses a pulse oximeter Goal pulse rate 80 to 90 bpm  Medication Instructions:  No changes  If you need a refill on your cardiac medications before your next appointment, please call your pharmacy.   Lab work: No new labs needed  Testing/Procedures: No new testing needed  Follow-Up: At [facility], you and your health needs are our priority.  As part of our continuing mission to provide you with exceptional heart care, we have created designated Provider Care Teams.  These Care Teams include your primary Cardiologist (physician) and Advanced Practice Providers (APPs -  Physician Assistants and Nurse Practitioners) who all work together to provide you with the care you need, when you need it.  You will need a follow up appointment in 6 months  Providers on your designated Care Team:   Lonni Meager, NP Bernardino Bring, PA-C Cadence [place], [place]  COVID-19 Vaccine Information can be found at: [email or web address] For questions related to vaccine distribution or appointments, please email [email or web address] or call [phone].

## 2023-11-04 ENCOUNTER — Other Ambulatory Visit: Payer: Self-pay | Admitting: Nurse Practitioner

## 2023-11-04 DIAGNOSIS — E669 Obesity, unspecified: Secondary | ICD-10-CM

## 2024-01-01 ENCOUNTER — Ambulatory Visit: Admitting: Nurse Practitioner

## 2024-01-01 VITALS — BP 110/80 | HR 88 | Temp 97.8°F | Ht 71.0 in | Wt 236.0 lb

## 2024-01-01 DIAGNOSIS — E669 Obesity, unspecified: Secondary | ICD-10-CM

## 2024-01-01 DIAGNOSIS — Z8673 Personal history of transient ischemic attack (TIA), and cerebral infarction without residual deficits: Secondary | ICD-10-CM | POA: Diagnosis not present

## 2024-01-01 NOTE — Progress Notes (Signed)
 Established Patient Office Visit  Subjective   Patient ID: Jeffery Small, male    DOB: 11-27-1960  Age: 63 y.o. MRN: [id number]  Chief Complaint  Patient presents with   Weight Check   Medication Management    Wegovy  recheck. Pt states he is doing pretty good. States he lost about 35 pounds.     HPI   Discussed the use of AI scribe software for clinical note transcription with the patient, who gave verbal consent to proceed.  History of Present Illness Jeffery Small is a 63 year old male who presents for follow-up on Wegovy  (semaglutide ) treatment.  He has been on Wegovy  (semaglutide ) 2.4 mg for approximately three months with no side effects such as constipation or nausea. His bowel movements are regular, and he feels the medication has significantly helped with appetite control.  He eats about three times a day, including snacks, and drinks only water. He engages in physical activity by going to the gym about three days a week, where he walks on the treadmill for a mile to a mile and a half, does crunches, and uses the torso machine for resistance training.  Since starting the medication, he has lost 36 pounds, reducing his weight from 272 pounds to 236 pounds. He notes feeling a difference in his feet, knees, and back since losing weight.  No nausea or constipation. Regular bowel movements.     Review of Systems  Constitutional:  Negative for chills and fever.  Respiratory:  Negative for shortness of breath.   Cardiovascular:  Negative for chest pain.  Gastrointestinal:  Negative for constipation.      Objective:     BP 110/80   Pulse 88   Temp 97.8 F (36.6 C) (Oral)   Ht 5' 11 (1.803 m)   Wt 236 lb (107 kg)   SpO2 98%   BMI 32.92 kg/m  BP Readings from Last 3 Encounters:  01/01/24 110/80  10/21/23 100/60  09/25/23 118/78   Wt Readings from Last 3 Encounters:  01/01/24 236 lb (107 kg)  10/21/23 246 lb 3.2 oz (111.7 kg)  09/25/23 252 lb 12.8 oz (114.7 kg)    SpO2 Readings from Last 3 Encounters:  01/01/24 98%  10/21/23 99%  09/25/23 98%      Physical Exam Vitals and nursing note reviewed.  Constitutional:      Appearance: Normal appearance.  Cardiovascular:     Rate and Rhythm: Normal rate and regular rhythm.     Heart sounds: Normal heart sounds.  Pulmonary:     Effort: Pulmonary effort is normal.     Breath sounds: Normal breath sounds.  Abdominal:     General: Bowel sounds are normal.  Neurological:     Mental Status: He is alert.      No results found for any visits on 01/01/24.    The ASCVD Risk score (Arnett DK, et al., 2019) failed to calculate for the following reasons:   Risk score cannot be calculated because patient has a medical history suggesting prior/existing ASCVD    Assessment & Plan:   Problem List Items Addressed This Visit       Other   History of stroke   Obesity (BMI 30-39.9) - Primary   Assessment and Plan Assessment & Plan Obesity Obesity managed with Wegovy  2.4 mg weekly, resulting in 36-pound weight loss and improved joint comfort. - Continue Wegovy  2.4 mg subcutaneous weekly. - Contact pharmacy for refills as needed. - Schedule follow-up for blood work  and physical examination in December. - Maintain current exercise routine: gym workouts three times a week, including treadmill, crunches, and resistance training.   Return in about 3 months (around 04/01/2024) for CPE and Labs.    Adina Crandall, NP

## 2024-01-07 ENCOUNTER — Other Ambulatory Visit: Payer: Self-pay | Admitting: Nurse Practitioner

## 2024-01-07 DIAGNOSIS — E785 Hyperlipidemia, unspecified: Secondary | ICD-10-CM

## 2024-01-07 DIAGNOSIS — Z8673 Personal history of transient ischemic attack (TIA), and cerebral infarction without residual deficits: Secondary | ICD-10-CM

## 2024-01-28 ENCOUNTER — Other Ambulatory Visit: Payer: Self-pay | Admitting: Nurse Practitioner

## 2024-01-28 DIAGNOSIS — E669 Obesity, unspecified: Secondary | ICD-10-CM

## 2024-01-29 ENCOUNTER — Telehealth: Payer: Self-pay

## 2024-01-29 ENCOUNTER — Other Ambulatory Visit (HOSPITAL_COMMUNITY): Payer: Self-pay

## 2024-01-29 NOTE — Telephone Encounter (Signed)
 Please advise.   Pt states that he was to have this medication approved. Any alternatives? Or diagnosis that would approve him.?

## 2024-01-29 NOTE — Telephone Encounter (Signed)
 As of October 1 [place] [employer] is no longer covering GLP1s for weight loss. For this encounter, I used patient comorbidity of stroke.  Pharmacy Patient Advocate Encounter   Received notification from Onbase that prior authorization for Wegovy  2.4 is required/requested.   Insurance verification completed.   The patient is insured through [facility] MEDICAID.   Per test claim: PA required; PA submitted to above mentioned insurance via Latent Key/confirmation #/EOC [id number] Status is pending

## 2024-01-29 NOTE — Telephone Encounter (Signed)
 Pharmacy Patient Advocate Encounter  Received notification from [facility] MEDICAID that Prior Authorization for Wegovy  2.4 has been DENIED.  No reason given; No denial letter received via Fax or CMM. It has been requested and will be uploaded to the media tab once received.   PA #/Case ID/Reference #: [id number]

## 2024-05-07 ENCOUNTER — Other Ambulatory Visit: Payer: Self-pay | Admitting: Cardiovascular Disease

## 2024-05-07 DIAGNOSIS — I4891 Unspecified atrial fibrillation: Secondary | ICD-10-CM

## 2024-05-18 NOTE — Progress Notes (Unsigned)
 Cardiology Office Note  Date:  05/19/2024   ID:  Jeffery Small, DOB 03/16/1961, MRN [id number]  PCP:  Wendee Lynwood HERO, NP   Chief Complaint  Patient presents with   6 month follow up     Doing well.     HPI:  Jeffery Small is a 64 year old gentleman with past medical history of Atrial fibrillation, paroxysmal stroke/TIA 2017, right arm deficit, presumed from afib Hyperlipidemia Hypertension Ascending aorta 4 cm Who presents for follow-up of his atrial fibrillation  Last seen in clinic 6/25 In follow-up today he reports feeling well Active at baseline Asymptomatic from his atrial fibrillation Continues metoprolol  succinate 100 twice daily Adequate rate control On Eliquis  5 twice daily, no bleeding  Active in the yard, goes to the gym  EKG personally reviewed by myself on todays visit EKG Interpretation Date/Time:  Tuesday May 19 2024 11:44:36 EST Ventricular Rate:  79 PR Interval:    QRS Duration:  70 QT Interval:  384 QTC Calculation: 440 R Axis:   8  Text Interpretation: Atrial flutter with variable A-V block When compared with ECG of 21-Oct-2023 12:00, Atrial flutter has replaced Atrial fibrillation Confirmed by Perla Lye ([id number]) on 05/19/2024 12:03:31 PM   Past medical history reviewed Atrial fibrillation noted November 2024 on EKG April 12, 2023 back in normal sinus rhythm colonoscopy May 23, 2023,  in atrial fibrillation with RVR rate 146 bpm  Echocardiogram January 2025  normal left and right ventricular function,  severely dilated left atrium, mild to moderate MR, Mild dilation ascending aorta 4 cm  Prior event monitor showing A-fib burden 59% Metoprolol  increased up to 50 twice daily  Previously on aspirin  Plavix  for history of TIA 2017  Echo 2017 Left ventricle: The cavity size was normal. There was moderate   concentric hypertrophy. Systolic function was normal. The   estimated ejection fraction was in the range of 55% to 60%. Wall    motion was normal; there were no regional wall motion   abnormalities. Features are consistent with a pseudonormal left    ventricular filling pattern, with concomitant abnormal relaxation    and increased filling pressure (grade 2 diastolic dysfunction).  - Aortic valve: Trileaflet; mildly thickened leaflets.  - Aorta: Aortic root dimension: 43 mm (ED).  - Aortic root: The aortic root was moderately dilated.  - Left atrium: The atrium was mildly dilated.  - Inferior vena cava: The vessel was mildly dilated.    PMH:   has a past medical history of TIA (transient ischemic attack) (2017), Hypercholesterolemia, Hyperlipidemia, and Hypertension.  PSH:    Past Surgical History:  Procedure Laterality Date   COLONOSCOPY     COLONOSCOPY WITH PROPOFOL  N/A 05/23/2023   Procedure: COLONOSCOPY WITH PROPOFOL ;  Surgeon: Unk Corinn Skiff, MD;  Location: [facility] ENDOSCOPY;  Service: Gastroenterology;  Laterality: N/A;   POLYPECTOMY  05/23/2023   Procedure: POLYPECTOMY;  Surgeon: Unk Corinn Skiff, MD;  Location: [facility] ENDOSCOPY;  Service: Gastroenterology;;   VASECTOMY     WISDOM TOOTH EXTRACTION      Current Outpatient Medications  Medication Sig Dispense Refill   apixaban  (ELIQUIS ) 5 MG TABS tablet Take 1 tablet (5 mg total) by mouth 2 (two) times daily. 180 tablet 3   Ascorbic Acid (VITAMIN C) 1000 MG tablet Take 1,000 mg by mouth daily.     atorvastatin  (LIPITOR) 40 MG tablet TAKE 1 TABLET BY MOUTH DAILY AT 6 PM. 90 tablet 1   fluticasone (FLONASE) 50 MCG/ACT nasal spray Place 2 sprays  into both nostrils daily as needed for allergies.      L-Lysine 1000 MG TABS Take by mouth daily in the afternoon.     metoprolol  succinate (TOPROL -XL) 100 MG 24 hr tablet TAKE 1 TABLET (100 MG TOTAL) BY MOUTH 2 (TWO) TIMES DAILY. TAKE WITH OR IMMEDIATELY FOLLOWING A MEAL 180 tablet 3   montelukast  (SINGULAIR ) 10 MG tablet Take 1 tablet (10 mg total) by mouth daily. 90 tablet 3   Multiple Vitamins-Minerals (MENS  50+ MULTIVITAMIN PO) Take by mouth.     semaglutide -weight management (WEGOVY ) 2.4 MG/0.75ML SOAJ SQ injection INJECT 2.4 MG INTO THE SKIN ONCE A WEEK. (Patient not taking: Reported on 05/19/2024) 9 mL 0   No current facility-administered medications for this visit.    Allergies:   Patient has no known allergies.   Social History:  The patient  reports that he quit smoking about 31 years ago. His smoking use included cigarettes. He started smoking about 18 years ago. He has never used smokeless tobacco. He reports that he does not drink alcohol and does not use drugs.   Family History:   family history includes Hypertension in his paternal grandfather and paternal grandmother; Suicidality in his father.    Review of Systems: Review of Systems  Constitutional: Negative.   HENT: Negative.    Respiratory: Negative.    Cardiovascular: Negative.   Gastrointestinal: Negative.   Musculoskeletal: Negative.   Neurological: Negative.   Psychiatric/Behavioral: Negative.    All other systems reviewed and are negative.  PHYSICAL EXAM: VS:  BP 120/80 (BP Location: Left Arm, Patient Position: Sitting, Cuff Size: Normal)   Pulse 79   Ht 6' (1.829 m)   Wt 248 lb (112.5 kg)   SpO2 97%   BMI 33.63 kg/m  , BMI Body mass index is 33.63 kg/m. Constitutional:  oriented to person, place, and time. No distress.  HENT:  Head: Normocephalic and atraumatic.  Eyes:  no discharge. No scleral icterus.  Neck: Normal range of motion. Neck supple. No JVD present.  Cardiovascular: Irregularly irregular, normal heart sounds and intact distal pulses. Exam reveals no gallop and no friction rub. No edema No murmur heard. Pulmonary/Chest: Effort normal and breath sounds normal. No stridor. No respiratory distress.  no wheezes.  no rales.  no tenderness.  Abdominal: Soft.  no distension.  no tenderness.  Musculoskeletal: Normal range of motion.  no  tenderness or deformity.  Neurological:  normal muscle tone.  Coordination normal. No atrophy Skin: Skin is warm and dry. No rash noted. not diaphoretic.  Psychiatric:  normal mood and affect. behavior is normal. Thought content normal.     Recent Labs: No results found for requested labs within last 365 days.    Lipid Panel Lab Results  Component Value Date   CHOL 147 03/21/2023   HDL 39.60 03/21/2023   LDLCALC 51 03/21/2023   TRIG 283.0 (H) 03/21/2023      Wt Readings from Last 3 Encounters:  05/19/24 248 lb (112.5 kg)  01/01/24 236 lb (107 kg)  10/21/23 246 lb 3.2 oz (111.7 kg)     ASSESSMENT AND PLAN:  Problem List Items Addressed This Visit       Cardiology Problems   Primary hypertension   Relevant Orders   EKG 12-Lead (Completed)   Atrial fibrillation (HCC) - Primary   Relevant Orders   EKG 12-Lead (Completed)   Hyperlipidemia   CVA (cerebral vascular accident) (HCC)     Other   Obesity (BMI 30-39.9)  Tachycardia   Relevant Orders   EKG 12-Lead (Completed)    Persistent atrial fibrillation -Decision made on prior office visit to pursue rate control Tolerating metoprolol  succinate 100 twice daily -No bleeding on Eliquis  Reports he is asymptomatic Would be difficult to maintain normal sinus rhythm given severely dilated left atrium  Dilated ascending aorta Previously noted on echocardiogram 2017 with aortic root 4.3 cm Repeat echocardiogram December 2024 with stable aorta 4 cm Echo every several years to evaluate aorta, could consider calcium  scoring at a later date  Essential hypertension Continue metoprolol  succinate 100 twice daily for A-fib rate control Blood pressure is well controlled on today's visit. No changes made to the medications.   Hyperlipidemia On Lipitor 40 daily Numbers at goal   Signed, Tim Anderson [place], M.D., Ph.D. [facility] [place], [place] [phone]

## 2024-05-19 ENCOUNTER — Encounter: Payer: Self-pay | Admitting: Cardiovascular Disease

## 2024-05-19 ENCOUNTER — Ambulatory Visit: Attending: Cardiovascular Disease | Admitting: Cardiovascular Disease

## 2024-05-19 VITALS — BP 120/80 | HR 79 | Ht 72.0 in | Wt 248.0 lb

## 2024-05-19 DIAGNOSIS — E669 Obesity, unspecified: Secondary | ICD-10-CM

## 2024-05-19 DIAGNOSIS — E782 Mixed hyperlipidemia: Secondary | ICD-10-CM | POA: Diagnosis not present

## 2024-05-19 DIAGNOSIS — R000 Tachycardia, unspecified: Secondary | ICD-10-CM

## 2024-05-19 DIAGNOSIS — I4819 Other persistent atrial fibrillation: Secondary | ICD-10-CM

## 2024-05-19 DIAGNOSIS — I10 Essential (primary) hypertension: Secondary | ICD-10-CM | POA: Diagnosis not present

## 2024-05-19 DIAGNOSIS — I639 Cerebral infarction, unspecified: Secondary | ICD-10-CM | POA: Diagnosis not present

## 2024-05-19 NOTE — Patient Instructions (Signed)

## 2024-06-04 ENCOUNTER — Other Ambulatory Visit: Payer: Self-pay | Admitting: Nurse Practitioner

## 2024-06-04 DIAGNOSIS — Z9109 Other allergy status, other than to drugs and biological substances: Secondary | ICD-10-CM

## 2024-06-15 ENCOUNTER — Encounter: Admitting: Nurse Practitioner
